# Patient Record
Sex: Female | Born: 1940 | Race: White | Hispanic: No | Marital: Married | Smoking: Never smoker
Health system: Southern US, Community
[De-identification: ages and names within clinical notes are randomized; demographics above are authoritative.]

## PROBLEM LIST (undated history)

## (undated) DIAGNOSIS — L719 Rosacea, unspecified: Secondary | ICD-10-CM

## (undated) DIAGNOSIS — E079 Disorder of thyroid, unspecified: Secondary | ICD-10-CM

## (undated) DIAGNOSIS — M8580 Other specified disorders of bone density and structure, unspecified site: Secondary | ICD-10-CM

## (undated) DIAGNOSIS — K635 Polyp of colon: Secondary | ICD-10-CM

## (undated) DIAGNOSIS — K52831 Collagenous colitis: Secondary | ICD-10-CM

## (undated) HISTORY — DX: Polyp of colon: K63.5

## (undated) HISTORY — DX: Collagenous colitis: K52.831

## (undated) HISTORY — DX: Disorder of thyroid, unspecified: E07.9

## (undated) HISTORY — PX: ABDOMINAL HYSTERECTOMY: SHX81

## (undated) HISTORY — DX: Rosacea, unspecified: L71.9

## (undated) HISTORY — DX: Other specified disorders of bone density and structure, unspecified site: M85.80

---

## 1982-05-30 HISTORY — PX: ABDOMINAL HYSTERECTOMY: SUR658

## 2002-07-25 ENCOUNTER — Ambulatory Visit (HOSPITAL_COMMUNITY): Admission: RE | Admit: 2002-07-25 | Discharge: 2002-07-25 | Payer: Self-pay | Admitting: Gastroenterology

## 2007-05-31 HISTORY — PX: THYROID SURGERY: SHX805

## 2007-08-20 ENCOUNTER — Encounter: Admission: RE | Admit: 2007-08-20 | Discharge: 2007-08-20 | Payer: Self-pay | Admitting: Gastroenterology

## 2008-01-10 ENCOUNTER — Other Ambulatory Visit: Admission: RE | Admit: 2008-01-10 | Discharge: 2008-01-10 | Payer: Self-pay | Admitting: Diagnostic Radiology

## 2008-01-10 ENCOUNTER — Encounter (INDEPENDENT_AMBULATORY_CARE_PROVIDER_SITE_OTHER): Payer: Self-pay | Admitting: Diagnostic Radiology

## 2008-01-10 ENCOUNTER — Encounter: Admission: RE | Admit: 2008-01-10 | Discharge: 2008-01-10 | Payer: Self-pay | Admitting: Family Medicine

## 2008-03-17 ENCOUNTER — Encounter (INDEPENDENT_AMBULATORY_CARE_PROVIDER_SITE_OTHER): Payer: Self-pay | Admitting: Surgery

## 2008-03-17 ENCOUNTER — Ambulatory Visit (HOSPITAL_COMMUNITY): Admission: RE | Admit: 2008-03-17 | Discharge: 2008-03-18 | Payer: Self-pay | Admitting: Surgery

## 2010-10-12 NOTE — Op Note (Signed)
NAMEASYRIA, KOLANDER NO.:  [id number]   MEDICAL RECORD NO.:  [id number]          PATIENT TYPE:  AMB   LOCATION:  DAY                          FACILITY:  [facility]   PHYSICIAN:  Currie Paris, M.D.DATE OF BIRTH:  [birth year]   DATE OF PROCEDURE:  03/17/2008  DATE OF DISCHARGE:                               OPERATIVE REPORT   PREOPERATIVE DIAGNOSIS:  1. 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  2. Hyperthyroidism.   POSTOPERATIVE DIAGNOSES:  1. 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  2. Hyperthyroidism.   OPERATION:  Total thyroidectomy.   SURGEON:  Dr. Jamey Ripa   ASSISTANT:  Dr. Johna Sheriff   ANESTHESIA:  General endotracheal.   CLINICAL HISTORY:  This is a 70 year old lady who has recently worked up  for some hearing loss, found to be somewhat hyperthyroid but also have a  cold dominant nodule on the left lobe of the thyroid which had atypical  cells on the FNA that could not rule out of papillary carcinoma.  After  lengthy discussion with the patient and in view of her with her  endocrinologist, Dr. Talmage Nap, we elected to proceed to a total  thyroidectomy.   DESCRIPTION OF PROCEDURE:  The patient was seen in the holding area.  She had no further questions.  We confirmed total thyroidectomy was the  planned procedure.   The patient taken to the operating room, after satisfactory general  endotracheal anesthesia obtained, the neck was prepped and draped as a  sterile field.  The time-out was done.   I outlined a transverse incision using this skin fold about one  fingerbreadth above the clavicular heads.  I measured at 6 cm.  The  platysma was divided and subplatysmal flaps raised.  The self-retaining  retractor was placed.   The midline fascia was opened to expose the thyroid gland.  The strap  muscles were elevated off of the left lobe and the lobe was rotated  medially.  A couple small vessels coming  into the inferior pole were  immediately visible and clipped and divided or using harmonic.  As I was  able to move this around, the lobe was quite small and I could feel the  nodule within it.  Dissection around superior pole so the superior pole  vessels readily visible and I went ahead and divided these using 2-0  silk ties plus harmonic to divide and ligate them.  With that done, I  was able to rotate the gland more medially.  I saw appeared to be an  inferior parathyroid and was careful to move that laterally.  I divided  the small vessels coming onto the thyroid lobe staying right up on the  gland to avoid injury to the nerve and as I got the gland were rotated,  I was able to dissect out and find the nerve and traced it to be sure we  did not injure it.   Against more small vessels were divided, either with using clips for  hemostasis or the harmonic.  Once I  got the gland rotated off medial  enough to see that the nerve was lying laterally, I then was able to  divide the attachments to the trachea with cautery.  I divided this and  mobilized the thyroid across the midline a little bit.   Attention was then turned to the right lobe and dissection was carried  out essentially the same.  The difference being on this side was that it  was a slightly bigger gland and I had to divide the superior pole  vessels and segments as I saw what appeared to be perhaps parathyroid  around one of the vessels.  Once I got those ligated, I was able to  again divide some of the inferior pole vessels and the medial vessels  and rotate the gland medially.  I then identified the nerve.  Once that  was successfully identified, was able to finish dividing small branches  coming into the thyroid staying high up on the thyroid gland to sweep  the tissues laterally.  I then identified again fairly clear-cut  inferior parathyroid gland and a potential superior parathyroid gland.   Once I got well medial, I  was able to then divide the remaining tracheal  attachments with cautery.  This left me with some midline attachments  superiorly.   This appeared to be a small pyramidal lobe and this was freed up and the  vessels coming, divided with clips and harmonic completing the total  thyroidectomy.   When I had finished the left side, I placed some Surgicel in place.  I  irrigated now the right side, made sure everything was dry, placed some  Surgicel here.  I turned my attention back to the left side and it had  remained dry.  I reirrigated, replaced a little Surgicel in but  everything appeared to be dry.   Midline fascia and the platysma was closed with 4-0 Vicryls.  Skin was  closed with staples, alternating with Steri-Strips.   The patient tolerated procedure well and there no complications.  All  counts were correct.      Currie Paris, M.D.  Electronically Signed     CJS/MEDQ  D:  03/17/2008  T:  03/17/2008  Job:  [id number]   cc:   Tammy R. Collins Scotland, M.D.  Fax: [phone]   Dorisann Frames, M.D.

## 2010-10-15 NOTE — Op Note (Signed)
   NAME:  Diane Becker, Diane Becker                            ACCOUNT NO.:  [id number]   MEDICAL RECORD NO.:  [id number]                   PATIENT TYPE:  AMB   LOCATION:  ENDO                                 FACILITY:  [facility]   PHYSICIAN:  Anselmo Rod, M.D.               DATE OF BIRTH:  11/09/1940   DATE OF PROCEDURE:  07/25/2002  DATE OF DISCHARGE:                                 OPERATIVE REPORT   PROCEDURE:  Colonoscopy.   ENDOSCOPIST:  Anselmo Rod, M.D.   INSTRUMENT USED:  Pediatric adjustable Olympus colonoscope.   INDICATIONS FOR PROCEDURE:  A 70 year old white female for screening  colonoscopy.  The patient has a family history of colon cancer in her  mother.  Rule out colonic polyps, masses, etc.   PREPROCEDURE PREPARATION:  Informed consent was secured from the patient.  The patient fasted for 8 hours prior to the procedure, and prepped with a  bottle of MiraLax and Gatorade the night prior to procedure.   PREPROCEDURE PHYSICAL:  VITAL SIGNS:  The patient had stable vital signs.  NECK:  Supple.  CHEST:  Clear to auscultation.  HEART:  S1, S2 regular.  ABDOMEN:  Soft with normal bowel sounds.  No masses palpable.   DESCRIPTION OF PROCEDURE:  The patient was placed in the left lateral  decubitus position and sedated with 50 mg of Demerol and 5 mg of Versed  intravenously.  Once the patient was adequately sedated and maintained on  low-flow oxygen, and continuous cardiac monitoring, the Olympus video  colonoscope was advanced into the rectum to the cecum and terminal ileum  with difficulty.  The patient had a few diverticula in the distal left  colon, but the rest of the mucosa after the cecum and terminal ileum  appeared normal.  The ileocecal valve and appendiceal orifice were clearly  visualized and photographed.   IMPRESSION:  Normal-appearing colonoscopy except for a few left-sided  diverticula.  No masses or polyps seen.   RECOMMENDATIONS:  1. A high fiber diet with  increased fluid intake has been added.  2. Repeat colorectal cancer screening was recommended in the next 5 years     unless the patient develops any abnormal symptoms intermittently.  3. The patient will follow up on a p.r.n. basis.                                               Anselmo Rod, M.D.    JNM/MEDQ  D:  07/25/2002  T:  07/25/2002  Job:  [id number]

## 2011-03-01 LAB — COMPREHENSIVE METABOLIC PANEL
Alkaline Phosphatase: 99
BUN: 11
Calcium: 9.4
Chloride: 103
Creatinine, Ser: 0.95
Glucose, Bld: 89
Potassium: 3.4 — ABNORMAL LOW
Total Bilirubin: 0.9
Total Protein: 7.3

## 2011-03-01 LAB — CALCIUM
Calcium: 8.3 — ABNORMAL LOW
Calcium: 8.6
Calcium: 8.6

## 2011-03-01 LAB — DIFFERENTIAL
Basophils Absolute: 0
Lymphocytes Relative: 35
Neutro Abs: 2.3
Neutrophils Relative %: 52

## 2011-03-01 LAB — URINALYSIS, ROUTINE W REFLEX MICROSCOPIC
Bilirubin Urine: NEGATIVE
Ketones, ur: NEGATIVE
Protein, ur: NEGATIVE
Urobilinogen, UA: 0.2

## 2011-03-01 LAB — T3: T3, Total: 116 (ref 80.0–204.0)

## 2011-03-01 LAB — CBC
Hemoglobin: 13.7
MCV: 91.4
Platelets: 309
RBC: 4.46
RDW: 13.7
WBC: 4.4

## 2011-11-14 LAB — HM COLONOSCOPY

## 2012-03-06 ENCOUNTER — Encounter: Payer: Self-pay | Admitting: Family Medicine

## 2012-03-29 ENCOUNTER — Telehealth: Payer: Self-pay | Admitting: Family Medicine

## 2012-03-29 NOTE — Telephone Encounter (Signed)
Spoke with patient.

## 2012-03-29 NOTE — Telephone Encounter (Signed)
Pt called and is req call back from [facility]. Pt said that she rcvd a call from someone at [facility].

## 2012-04-13 ENCOUNTER — Ambulatory Visit (INDEPENDENT_AMBULATORY_CARE_PROVIDER_SITE_OTHER): Payer: Medicare Other | Admitting: Family Medicine

## 2012-04-13 ENCOUNTER — Encounter: Payer: Self-pay | Admitting: Family Medicine

## 2012-04-13 VITALS — BP 104/68 | HR 60 | Temp 98.8°F | Ht 60.0 in | Wt 105.0 lb

## 2012-04-13 DIAGNOSIS — K5289 Other specified noninfective gastroenteritis and colitis: Secondary | ICD-10-CM

## 2012-04-13 DIAGNOSIS — L719 Rosacea, unspecified: Secondary | ICD-10-CM | POA: Insufficient documentation

## 2012-04-13 DIAGNOSIS — E039 Hypothyroidism, unspecified: Secondary | ICD-10-CM | POA: Insufficient documentation

## 2012-04-13 DIAGNOSIS — K52831 Collagenous colitis: Secondary | ICD-10-CM | POA: Insufficient documentation

## 2012-04-13 NOTE — Progress Notes (Signed)
Chief Complaint  Patient presents with  . Establish Care    HPI:  Diane Becker is here to establish care. Had been seeing Dr. Collins Scotland but moved care to here because husband comes here. Last physical in 07/2011. Does get regular exercise - walks [facility]. Eats healthy diet. Retired Engineer, structural. UTD on mammo and colonoscopy. Bone dexa last year - gets ever 3-5 years and stable. [employer] - but attends [employer] with husband.   Has the following concerns today:  Hypothyroidism: -s/p thyroidectomy, for nodules -stable on synthroid daily except two days per week - prescribed by endocrinologist -TSH stable for a long time, last check with physical in 07/2011  Other Providers: -Eye Doctor, Dr. Micheal Likens, for vision -Dr. Loreta Ave Lewayne Bunting, [facility]), GI, for hx of colitis, last colonoscopy 4 months ago for colitis - gets every five years for FH colon history -Dr. Nicholas Lose: Derm, rosacea and ocular rosacea  Vaccines: thinks UTD on tetanus and will get dates on this to [place], had shingles vaccines. Refused flu and pneumonia vaccines.   ROS: See pertinent positives and negatives per HPI. See scanned information.  Past Medical History  Diagnosis Date  . Colon polyps   . Thyroid disease   . Rosacea     sees dermatologist Dr. Nicholas Lose for this  . Colitis, collagenous     Followed by Dr. Loreta Ave at [facility] in GI    Family History  Problem Relation Age of Onset  . Colon cancer      History   Social History  . Marital Status: Married    Spouse Name: N/A    Number of Children: N/A  . Years of Education: N/A   Social History Main Topics  . Smoking status: Never Smoker   . Smokeless tobacco: None  . Alcohol Use: No  . Drug Use:   . Sexually Active:    Other Topics Concern  . None   Social History Narrative  . None    Current outpatient prescriptions:aspirin 81 MG tablet, Take 81 mg by mouth daily., Disp: , Rfl: ;  Biotin 10 MG TABS, Take  by mouth., Disp: , Rfl: ;  calcium-vitamin D (OSCAL WITH D) 500-200 MG-UNIT per tablet, Take 1 tablet by mouth daily., Disp: , Rfl: ;  doxycycline (VIBRAMYCIN) 100 MG capsule, Take 100 mg by mouth 3 (three) times a week. , Disp: , Rfl:  levothyroxine (SYNTHROID, LEVOTHROID) 50 MCG tablet, Take 50 mcg by mouth daily., Disp: , Rfl: ;  Multiple Vitamin (MULTIVITAMIN) tablet, Take 1 tablet by mouth daily., Disp: , Rfl:   EXAM:  Filed Vitals:   04/13/12 1310  BP: 104/68  Pulse: 60  Temp: 98.8 F (37.1 C)    Body mass index is 20.51 kg/(m^2).  GENERAL: vitals reviewed and listed above, alert, oriented, appears well hydrated and in no acute distress  HEENT: atraumatic, conjunttiva clear, no obvious abnormalities on inspection of external nose and ears  NECK: no obvious masses on inspection  LUNGS: clear to auscultation bilaterally, no wheezes, rales or rhonchi, good air movement  CV: HRRR, no peripheral edema  MS: moves all extremities without noticeable abnormality  PSYCH: pleasant and cooperative, no obvious depression or anxiety  ASSESSMENT AND PLAN:  Discussed the following assessment and plan:  1. Hypothyroid   2. Colitis, collagenous - followed by Dr. Loreta Ave at [facility]   3. Rosacea - followed by Dr. Micheal Likens   -offered to check [facility] today -  pt reports only gets this annually and requested we do it at her physical in a few months -We reviewed the PMH, PSH, FH, SH, Meds and Allergies. -We provided refills for any medications we will prescribe as needed. -We addressed current concerns per orders and patient instructions. -We have asked for records for pertinent exams, studies, vaccines and notes from previous providers. -We have advised patient to follow up per instructions below.   -Patient advised to return or notify a doctor immediately if symptoms worsen or persist or new concerns arise.  Patient Instructions    -PLEASE SIGN UP FOR MYCHART TODAY   We recommend  the following healthy lifestyle measures: - eat a healthy diet consisting of lots of vegetables, fruits, beans, nuts, seeds, healthy meats such as white chicken and fish and whole grains.  - avoid fried foods, fast food, processed foods, sodas, red meet and other fattening foods.  - get a least 150 minutes of aerobic exercise per week.   Follow up in: in the spring for your yearly physical      Terressa Koyanagi.

## 2012-04-13 NOTE — Patient Instructions (Addendum)
 -  PLEASE SIGN UP FOR MYCHART TODAY   We recommend the following healthy lifestyle measures: - eat a healthy diet consisting of lots of vegetables, fruits, beans, nuts, seeds, healthy meats such as white chicken and fish and whole grains.  - avoid fried foods, fast food, processed foods, sodas, red meet and other fattening foods.  - get a least 150 minutes of aerobic exercise per week.   Follow up in: in the spring for your yearly physical

## 2012-07-11 ENCOUNTER — Ambulatory Visit (INDEPENDENT_AMBULATORY_CARE_PROVIDER_SITE_OTHER): Payer: Medicare Other | Admitting: Family Medicine

## 2012-07-11 ENCOUNTER — Encounter: Payer: Self-pay | Admitting: Family Medicine

## 2012-07-11 VITALS — BP 100/76 | HR 91 | Temp 97.5°F | Ht 60.0 in | Wt 97.0 lb

## 2012-07-11 DIAGNOSIS — Z23 Encounter for immunization: Secondary | ICD-10-CM

## 2012-07-11 DIAGNOSIS — Z0000 Encounter for general adult medical examination without abnormal findings: Secondary | ICD-10-CM

## 2012-07-11 DIAGNOSIS — E039 Hypothyroidism, unspecified: Secondary | ICD-10-CM

## 2012-07-11 DIAGNOSIS — Z131 Encounter for screening for diabetes mellitus: Secondary | ICD-10-CM

## 2012-07-11 LAB — CBC WITH DIFFERENTIAL/PLATELET
Basophils Relative: 1 % (ref 0.0–3.0)
Eosinophils Absolute: 0 10*3/uL (ref 0.0–0.7)
Eosinophils Relative: 0.3 % (ref 0.0–5.0)
Hemoglobin: 14 g/dL (ref 12.0–15.0)
MCHC: 33.1 g/dL (ref 30.0–36.0)
MCV: 93.2 fl (ref 78.0–100.0)
Monocytes Absolute: 0.3 10*3/uL (ref 0.1–1.0)
Neutrophils Relative %: 65.3 % (ref 43.0–77.0)
RBC: 4.54 Mil/uL (ref 3.87–5.11)

## 2012-07-11 LAB — LIPID PANEL: Total CHOL/HDL Ratio: 4

## 2012-07-11 LAB — BASIC METABOLIC PANEL
BUN: 16 mg/dL (ref 6–23)
CO2: 26 mEq/L (ref 19–32)
Calcium: 9.4 mg/dL (ref 8.4–10.5)
Potassium: 3.5 mEq/L (ref 3.5–5.1)

## 2012-07-11 NOTE — Progress Notes (Signed)
Chief Complaint  Patient presents with  . Annual Exam    HPI:  Here for CPE:  -Concerns today:  Wants to check TSH today Has collagenous colitis and has been on oral budesonide for this for several months finished yesterday - is worried about her bone health. Has appt with Dr. Loreta Ave. She thinks she had a bone density test last year which was ok per her report - done with [facility] on [address].  -Diet: variety of foods, balance and well rounded, larger portion sizes - was eating a lot of carbs but now working on decreasing   -Exercise: walks [facility] and [facility]  -Diabetes and Dyslipidemia Screening: will check today  -Hx of HTN: no  -Vaccines: UTD on shingles, wants tdap today, pneumo and flu discussed at last visit  -pap history: s/p hysterectomy  -FDLMP: N/A  -sexual activity: yes, female partner, no new partners  -wants STI testing: no  -FH breast, colon or ovarian ca: see FH -had mammogram 1 year ago -had bone density 1 year ago -had colonoscpy in 10/2011  -Alcohol, Tobacco, drug use: see social history  Review of Systems - denies CP, SOB, fevers, sudden unintentional weigth changes, rashes or worrisome skin lesions, bleeding, falls, depression, does admit to fatigue  Past Medical History  Diagnosis Date  . Colon polyps   . Thyroid disease   . Rosacea     sees dermatologist Dr. Nicholas Lose for this  . Colitis, collagenous     Followed by Dr. Lore[facility] Ave at [facility] in GI    Family History  Problem Relation Age of Onset  . Colon cancer      History   Social History  . Marital Status: Married    Spouse Name: N/A    Number of Children: N/A  . Years of Education: N/A   Social History Main Topics  . Smoking status: Never Smoker   . Smokeless tobacco: None  . Alcohol Use: No  . Drug Use:   . Sexually Active:    Other Topics Concern  . None   Social History Narrative  . None    Current outpatient prescriptions:aspirin 81 MG tablet, Take 81 mg by mouth  daily., Disp: , Rfl: ;  Biotin 10 MG TABS, Take by mouth., Disp: , Rfl: ;  calcium-vitamin D (OSCAL WITH D) 500-200 MG-UNIT per tablet, Take 1 tablet by mouth daily., Disp: , Rfl: ;  doxycycline (VIBRAMYCIN) 100 MG capsule, Take 100 mg by mouth 3 (three) times a week. , Disp: , Rfl:  levothyroxine (SYNTHROID, LEVOTHROID) 50 MCG tablet, Take 50 mcg by mouth daily., Disp: , Rfl: ;  Multiple Vitamin (MULTIVITAMIN) tablet, Take 1 tablet by mouth daily., Disp: , Rfl: ;  Probiotic Product (PROBIOTIC DAILY PO), Take by mouth daily., Disp: , Rfl: ;  budesonide (ENTOCORT EC) 3 MG 24 hr capsule, Take 6 mg by mouth every morning., Disp: , Rfl:   EXAM:  Filed Vitals:   07/11/12 0800  BP: 100/76  Pulse: 91  Temp: 97.5 F (36.4 C)    GENERAL: vitals reviewed and listed below, alert, oriented, appears well hydrated and in no acute distress  HEENT: head atraumatic, PERRLA, normal appearance of eyes, ears, nose and mouth. moist mucus membranes.  NECK: supple, no masses or lymphadenopathy  LUNGS: clear to auscultation bilaterally, no rales, rhonchi or wheeze  CV: HRRR, no peripheral edema or cyanosis, normal pedal pulses  BREAST: normal appearance - no lesions or discharge, on palpation normal breast tissue  without any suspicious masses, breast implants bilat  ABDOMEN: bowel sounds normal, soft, non tender to palpation, no masses, no rebound or guarding  GU: deferred  RECTAL: refused  SKIN: no rash or abnormal lesions  MS: normal gait, moves all extremities normally  NEURO: CN II-XII grossly intact, normal muscle strength and sensation to light touch on extremities  PSYCH: normal affect, pleasant and cooperative  ASSESSMENT AND PLAN:  Discussed the following assessment and plan:  1. Hypothyroid  TSH   TSH  2. Encounter for preventive health examination  Lipid Panel   Lipid Panel   Hemoglobin A1c   CBC with Differential   Basic metabolic panel   Vitamin D, 25-hydroxy     -Discussed  and advised all [place] preventive services health task force level A and B recommendations for age, sex and risks. -FASTING LABS: lipids, hgba1c, TSH, adding CBC amd BMP given fatigue and steroid use - advised HgbA1c could be elevated given recent steroid use  -tdap today -will attempt to get last dexa results and will repeat if >2 years -Advised at least 150 minutes of exercise per week and a healthy diet low in saturated fats and sweets and consisting of fresh fruits and vegetables, lean meats such as fish and white chicken and whole grains. -follow up in 6 months or sooner if concerns or pending labs  -labs, studies and vaccines per orders this encounter  Orders Placed This Encounter  Procedures  . TSH  . Lipid Panel  . Hemoglobin A1c  . CBC with Differential  . Basic metabolic panel  . Vitamin D, 25-hydroxy    There are no Patient Instructions on file for this visit.  Patient advised to return to clinic immediately if symptoms worsen or persist or new concerns.  @LIFEPLAN @  No Follow-up on file.  Kriste Basque R.

## 2012-07-11 NOTE — Addendum Note (Signed)
Addended by: Azucena Freed on: 07/11/2012 09:09 AM   Modules accepted: Orders

## 2012-07-12 LAB — VITAMIN D 25 HYDROXY (VIT D DEFICIENCY, FRACTURES): Vit D, 25-Hydroxy: 52 ng/mL (ref 30–89)

## 2012-07-14 ENCOUNTER — Telehealth: Payer: Self-pay | Admitting: Family Medicine

## 2012-07-14 NOTE — Telephone Encounter (Signed)
Please let her know, -most labs including diabetes lab looked good except for cholesterol which is quite high -would advised appointment sometime in the next few months to discuss this and treatment options

## 2012-07-16 NOTE — Telephone Encounter (Signed)
Left a message for pt to return call.  Also made pt aware that [facility] information was received.

## 2012-07-16 NOTE — Telephone Encounter (Signed)
Called and spoke with pt and pt is aware.  

## 2012-07-18 ENCOUNTER — Encounter: Payer: Self-pay | Admitting: Family Medicine

## 2012-07-20 ENCOUNTER — Encounter: Payer: Self-pay | Admitting: Family Medicine

## 2012-07-20 NOTE — Progress Notes (Signed)
DEXA and Mammo from 04/2011 received.  DEXA with mild osteopenia - recs for cal/vit D/exercise and repeat in 2 years. S/p boniva therapy.  Mammo normal

## 2012-09-18 ENCOUNTER — Other Ambulatory Visit: Payer: Self-pay

## 2012-09-18 MED ORDER — LEVOTHYROXINE SODIUM 50 MCG PO TABS: ORAL_TABLET | ORAL | Status: DC

## 2012-09-18 NOTE — Telephone Encounter (Signed)
Pt left a form to fax back for levothyroxine 50 mcg- take 1 tablet by mouth Monday through Friday and take 2 tablets by mouth Saturday and Sunday. Pt request 90 day supply. Rx faxed to pharmacy.   Called pt to make aware.

## 2012-09-20 ENCOUNTER — Telehealth: Payer: Self-pay | Admitting: Family Medicine

## 2012-09-20 MED ORDER — ONE-DAILY MULTI VITAMINS PO TABS: ORAL_TABLET | ORAL | Status: DC

## 2012-09-20 NOTE — Telephone Encounter (Signed)
Rx sent to [facility] with correct qty.

## 2012-09-20 NOTE — Telephone Encounter (Signed)
PT called and stated that [facility] called her regarding her rx of levothyroxine (SYNTHROID, LEVOTHROID) 50 MCG tablet. They told her that the quantity needed to be 108. Please assist.

## 2012-09-25 ENCOUNTER — Telehealth: Payer: Self-pay

## 2012-09-25 NOTE — Telephone Encounter (Signed)
Called [facility] and spoke with Diane Becker and he states the multivitamin rx was closed out. Pt is aware.

## 2012-09-25 NOTE — Telephone Encounter (Signed)
Pt called and states that multivitamin was sent to [facility].  Advised pt that [facility] would be called and rx cancelled.

## 2012-12-14 ENCOUNTER — Encounter: Payer: Self-pay | Admitting: Family Medicine

## 2013-02-12 ENCOUNTER — Other Ambulatory Visit: Payer: Self-pay

## 2013-02-12 MED ORDER — LEVOTHYROXINE SODIUM 50 MCG PO TABS: ORAL_TABLET | ORAL | Status: DC

## 2013-04-04 ENCOUNTER — Other Ambulatory Visit: Payer: Self-pay

## 2013-06-07 ENCOUNTER — Telehealth: Payer: Self-pay | Admitting: Family Medicine

## 2013-06-07 MED ORDER — LEVOTHYROXINE SODIUM 50 MCG PO TABS: ORAL_TABLET | ORAL | Status: DC

## 2013-06-07 NOTE — Telephone Encounter (Signed)
Pt has appt in feb. Pt need new rx levothyroxine 50 mcg #30 no refills sent to [facility].

## 2013-06-07 NOTE — Telephone Encounter (Signed)
Rx sent to pharmacy   

## 2013-07-12 ENCOUNTER — Ambulatory Visit (INDEPENDENT_AMBULATORY_CARE_PROVIDER_SITE_OTHER): Payer: Medicare HMO | Admitting: Family Medicine

## 2013-07-12 ENCOUNTER — Encounter: Payer: Self-pay | Admitting: Family Medicine

## 2013-07-12 VITALS — BP 110/60 | Temp 98.7°F | Wt 97.0 lb

## 2013-07-12 DIAGNOSIS — M8580 Other specified disorders of bone density and structure, unspecified site: Secondary | ICD-10-CM

## 2013-07-12 DIAGNOSIS — E785 Hyperlipidemia, unspecified: Secondary | ICD-10-CM

## 2013-07-12 DIAGNOSIS — M899 Disorder of bone, unspecified: Secondary | ICD-10-CM

## 2013-07-12 DIAGNOSIS — L719 Rosacea, unspecified: Secondary | ICD-10-CM

## 2013-07-12 DIAGNOSIS — E039 Hypothyroidism, unspecified: Secondary | ICD-10-CM

## 2013-07-12 DIAGNOSIS — R252 Cramp and spasm: Secondary | ICD-10-CM

## 2013-07-12 DIAGNOSIS — Z0000 Encounter for general adult medical examination without abnormal findings: Secondary | ICD-10-CM

## 2013-07-12 DIAGNOSIS — M949 Disorder of cartilage, unspecified: Secondary | ICD-10-CM

## 2013-07-12 LAB — LIPID PANEL
CHOL/HDL RATIO: 4
Cholesterol: 289 mg/dL — ABNORMAL HIGH (ref 0–200)
HDL: 77.5 mg/dL (ref >39.00)
TRIGLYCERIDES: 125 mg/dL (ref 0.0–149.0)
VLDL: 25 mg/dL (ref 0.0–40.0)

## 2013-07-12 LAB — TSH: TSH: 2.47 u[IU]/mL (ref 0.35–5.50)

## 2013-07-12 LAB — T4, FREE: FREE T4: 1.36 ng/dL (ref 0.60–1.60)

## 2013-07-12 LAB — BASIC METABOLIC PANEL
BUN: 24 mg/dL — AB (ref 6–23)
CHLORIDE: 105 meq/L (ref 96–112)
CO2: 25 mEq/L (ref 19–32)
CREATININE: 2.8 mg/dL — AB (ref 0.4–1.2)
Calcium: 9.5 mg/dL (ref 8.4–10.5)
GFR: 17.66 mL/min — AB (ref >60.00)
Glucose, Bld: 86 mg/dL (ref 70–99)
POTASSIUM: 4.9 meq/L (ref 3.5–5.1)
Sodium: 139 mEq/L (ref 135–145)

## 2013-07-12 LAB — LDL CHOLESTEROL, DIRECT: Direct LDL: 181.3 mg/dL

## 2013-07-12 MED ORDER — LEVOTHYROXINE SODIUM 50 MCG PO TABS: ORAL_TABLET | ORAL | Status: DC

## 2013-07-12 NOTE — Progress Notes (Signed)
Pre visit review using our clinic review tool, if applicable. No additional management support is needed unless otherwise documented below in the visit note. 

## 2013-07-12 NOTE — Progress Notes (Signed)
/;jMedicare Annual Preventive Care Visit  (initial annual wellness or annual wellness exam)  Follow up and/or concerns:  Hypothyroidism: -stable -needs tsh check  Foot and leg cramps: -improved now, occasional 1x per month -sometimes in leg, sometimes in foot usually at night  Colitis, collagenous: -followed by Dr. Collene Mares -on 3 month course of oral steroid for this  Osteopenia: -on boniva in the past for 5 years in the past -R hip pain recenlty - lateral hip  Not exercising due to the cold and sits around and reads a lot. When not exercising feel more tired at night. Not really fatigued. She does still walk most days. Denies: fevers, weight changes.  1.) Patient-completed health risk assessment  - completed and reviewed, see scanned documentation  2.) Review of Medical History: -PMH, PSH, Family History and current specialty and care providers reviewed and updated and listed below  - see chart and below  3.) Review of functional ability and level of safety:  Any difficulty hearing? NO  History of falling? NO  Any trouble with IADLs - using a phone, using transportation, grocery shopping, preparing meals, doing housework, doing laundry, taking medications and managing money? NO  Advance Directives? YES   See summary of recommendations in Patient Instructions below.  4.) Physical Exam Filed Vitals:   07/12/13 0809  BP: 110/60  Temp: 98.7 F (37.1 C)   Estimated body mass index is 18.94 kg/(m^2) as calculated from the following:   Height as of 07/11/12: 5' (1.524 m).   Weight as of this encounter: 97 lb (43.999 kg).   NECK:  HEART:  LUNGS: Mini Cog: 1. Patient instructed to listen carefully and repeat the following: 3/3 Apple Watch    Penny  2. Clock drawing test was administered: NORMAL      3. Recall of three words: 3/3  Scoring:  Patient Score: NEG  See scanned doc  See patient instructions for recommendations.  4)The following written  screening schedule of preventive measures were reviewed with assessment and plan made per below, orders and patient instructions:           Alcohol screening: none     Obesity Screening and counseling: NA     STI screening: deferred     Tobacco Screening: done       Pneumococcal (PPSV23 -one dose after 64, one before if risk factors), influenza yearly and hepatitis B vaccines (if high risk - end stage renal disease, IV drugs, homosexual men, live in home for mentally retarded, hemophilia receiving factors) ASSESSMENT/PLAN: refused      Screening mammograph (yearly if >40) ASSESSMENT/PLAN: does yearly, 10/2012      Colorectal cancer screening (FOBT yearly or flex sig q4y or colonoscopy q10y or barium enema q4y) ASSESSMENT/PLAN: done 10/2011      Diabetes outpatient self-management training services ASSESSMENT/PLAN: N/A      Bone mass measurements(covered q2y if indicated - estrogen def, osteoporosis, hyperparathyroid, vertebral abnormalities, osteoporosis or steroids) ASSESSMENT/PLAN: done 2012, offered - she will set up      Screening for glaucoma(q1y if high risk - diabetes, FH, AA and > 50 or hispanic and > 65) ASSESSMENT/PLAN: done      Medical nutritional therapy for individuals with diabetes or renal disease ASSESSMENT/PLAN: N/A      Cardiovascular screening blood tests (lipids q5y) ASSESSMENT/PLAN:  Done 2014 - will check today as well      Diabetes screening tests ASSESSMENT/PLAN: done 2014   7.) Summary: -risk factors and conditions per above assessment  were discussed and treatment, recommendations and referrals were offered per documentation above and orders and patient instructions.  TSH today FASTING LABS today  Medicare annual wellness visit, subsequent  Rosacea - followed by Dr. Ubaldo Glassing  - Plan: Ambulatory referral to Dermatology  Hypothyroid - Plan: TSH, T4, Free  Leg cramps - Plan: Basic metabolic panel  Osteopenia - Plan: Vitamin D, 25-hydroxy  Other  and unspecified hyperlipidemia - Plan: Lipid Panel

## 2013-07-12 NOTE — Patient Instructions (Signed)
-  Vitamin D3 1000 IU daily - 1200mg  of calcium daily (likely half of this is in diet)  -heat and stretching for hip and follow up in 1 month if persists      Alcohol screening: none     Obesity Screening and counseling: NA     STI screening: deferred     Tobacco Screening: done       Pneumococcal (PPSV23 -one dose after 64, one before if risk factors), influenza yearly and hepatitis B vaccines (if high risk - end stage renal disease, IV drugs, homosexual men, live in home for mentally retarded, hemophilia receiving factors) ASSESSMENT/PLAN: refused      Screening mammograph (yearly if >40) ASSESSMENT/PLAN: does yearly, 10/2012      Colorectal cancer screening (FOBT yearly or flex sig q4y or colonoscopy q10y or barium enema q4y) ASSESSMENT/PLAN: done 10/2011      Diabetes outpatient self-management training services ASSESSMENT/PLAN: N/A      Bone mass measurements(covered q2y if indicated - estrogen def, osteoporosis, hyperparathyroid, vertebral abnormalities, osteoporosis or steroids) ASSESSMENT/PLAN: done 2012, offered - she will set up      Screening for glaucoma(q1y if high risk - diabetes, FH, AA and > 50 or hispanic and > 65) ASSESSMENT/PLAN: done      Medical nutritional therapy for individuals with diabetes or renal disease ASSESSMENT/PLAN: N/A      Cardiovascular screening blood tests (lipids q5y) ASSESSMENT/PLAN:  Done 2014 - will check today as well      Diabetes screening tests ASSESSMENT/PLAN: done 2014

## 2013-07-13 LAB — VITAMIN D 25 HYDROXY (VIT D DEFICIENCY, FRACTURES): Vit D, 25-Hydroxy: 53 ng/mL (ref 30–89)

## 2013-07-15 ENCOUNTER — Other Ambulatory Visit: Payer: Self-pay | Admitting: Family Medicine

## 2013-07-15 ENCOUNTER — Other Ambulatory Visit (INDEPENDENT_AMBULATORY_CARE_PROVIDER_SITE_OTHER): Payer: Medicare HMO

## 2013-07-15 DIAGNOSIS — N289 Disorder of kidney and ureter, unspecified: Secondary | ICD-10-CM

## 2013-07-15 LAB — BASIC METABOLIC PANEL
BUN: 16 mg/dL (ref 6–23)
CHLORIDE: 101 meq/L (ref 96–112)
CO2: 28 mEq/L (ref 19–32)
Calcium: 8.9 mg/dL (ref 8.4–10.5)
Creatinine, Ser: 0.9 mg/dL (ref 0.4–1.2)
GFR: 70.19 mL/min (ref >60.00)
Glucose, Bld: 80 mg/dL (ref 70–99)
POTASSIUM: 3.4 meq/L — AB (ref 3.5–5.1)
SODIUM: 136 meq/L (ref 135–145)

## 2013-08-16 ENCOUNTER — Telehealth: Payer: Self-pay

## 2013-08-16 NOTE — Telephone Encounter (Signed)
Pt received a message from [facility] office and this is not pt's dermatology office. Pt has established with Dr. Ubaldo Glassing and pt will be seeing him.

## 2013-09-24 ENCOUNTER — Other Ambulatory Visit: Payer: Self-pay | Admitting: Family Medicine

## 2013-09-24 ENCOUNTER — Ambulatory Visit (INDEPENDENT_AMBULATORY_CARE_PROVIDER_SITE_OTHER)
Admission: RE | Admit: 2013-09-24 | Discharge: 2013-09-24 | Disposition: A | Discharge status: Home / Self Care | Payer: Medicare HMO | Source: Ambulatory Visit | Attending: Family Medicine | Admitting: Family Medicine

## 2013-09-24 DIAGNOSIS — M79609 Pain in unspecified limb: Secondary | ICD-10-CM

## 2013-09-24 DIAGNOSIS — M79671 Pain in right foot: Secondary | ICD-10-CM

## 2013-09-25 ENCOUNTER — Telehealth: Payer: Self-pay | Admitting: *Deleted

## 2013-09-25 NOTE — Telephone Encounter (Signed)
Diane Becker is a 73 year old female who [place] and had severe pain in the lateral portion of the right lower foot. She elevated it and iced it. She has a neighbor so I saw her on Monday morning. She has some ecchymosis basilar right fifth metatarsal. I sent her for x-ray which showed a nondisplaced fracture of the base of the fifth metatarsal. Consult with Dr. Durward Fortes he concurred elevation ice immobilization.  She has a history of osteopenia and was on the nevi for 5 years

## 2013-10-16 ENCOUNTER — Other Ambulatory Visit: Payer: Self-pay | Admitting: Family Medicine

## 2013-10-16 DIAGNOSIS — S92901A Unspecified fracture of right foot, initial encounter for closed fracture: Secondary | ICD-10-CM

## 2013-10-22 ENCOUNTER — Other Ambulatory Visit: Payer: Self-pay | Admitting: *Deleted

## 2013-10-22 ENCOUNTER — Ambulatory Visit (INDEPENDENT_AMBULATORY_CARE_PROVIDER_SITE_OTHER)
Admission: RE | Admit: 2013-10-22 | Discharge: 2013-10-22 | Disposition: A | Discharge status: Home / Self Care | Payer: Medicare HMO | Source: Ambulatory Visit | Attending: Family Medicine | Admitting: Family Medicine

## 2013-10-22 DIAGNOSIS — S92909A Unspecified fracture of unspecified foot, initial encounter for closed fracture: Secondary | ICD-10-CM

## 2013-10-22 DIAGNOSIS — S92901A Unspecified fracture of right foot, initial encounter for closed fracture: Secondary | ICD-10-CM

## 2013-10-22 MED ORDER — BUDESONIDE 3 MG PO CP24: 6.0000 mg | ORAL_CAPSULE | Freq: Three times a day (TID) | ORAL | Status: DC

## 2013-10-31 ENCOUNTER — Encounter: Payer: Self-pay | Admitting: *Deleted

## 2013-12-25 ENCOUNTER — Telehealth: Payer: Self-pay | Admitting: Family Medicine

## 2013-12-25 DIAGNOSIS — Z1239 Encounter for other screening for malignant neoplasm of breast: Secondary | ICD-10-CM

## 2013-12-25 NOTE — Telephone Encounter (Signed)
I called the pt and advised her the order was placed and someone from [place] will call with an appt.  She stated she already has an appt on 8/6 and I informed Neoma Laming of this.

## 2013-12-25 NOTE — Telephone Encounter (Signed)
Pt req referral to [facility] on [address] for a mammogram

## 2014-01-02 LAB — HM MAMMOGRAPHY: HM Mammogram: NEGATIVE

## 2014-01-03 ENCOUNTER — Encounter: Payer: Self-pay | Admitting: Family Medicine

## 2014-01-17 ENCOUNTER — Telehealth: Payer: Self-pay | Admitting: *Deleted

## 2014-01-17 NOTE — Telephone Encounter (Signed)
Dr Maudie Mercury reviewed the bone density test from [facility]. I called the pt and informed her Dr Maudie Mercury stated there was a decrease in bone density compared to the test from 05/03/2011 and to be sure to have a repeat test in 2 years, continue dietary intake of calcium and vitamin D and weight bearing exercises as tolerated and the pt agreed.

## 2014-01-30 ENCOUNTER — Encounter: Payer: Self-pay | Admitting: Family Medicine

## 2014-06-25 ENCOUNTER — Telehealth: Payer: Self-pay | Admitting: Family Medicine

## 2014-06-25 MED ORDER — LEVOTHYROXINE SODIUM 50 MCG PO TABS: ORAL_TABLET | ORAL | Status: DC

## 2014-06-25 NOTE — Telephone Encounter (Signed)
Rx done. 

## 2014-06-25 NOTE — Telephone Encounter (Signed)
Pt request refill of the following: levothyroxine (SYNTHROID, LEVOTHROID) 50 MCG tablet   Phamacy: [facility]

## 2014-08-28 ENCOUNTER — Ambulatory Visit (INDEPENDENT_AMBULATORY_CARE_PROVIDER_SITE_OTHER): Payer: Commercial Managed Care - HMO | Admitting: Family Medicine

## 2014-08-28 ENCOUNTER — Encounter: Payer: Self-pay | Admitting: Family Medicine

## 2014-08-28 VITALS — BP 112/68 | HR 65 | Temp 97.9°F | Ht 61.0 in | Wt 102.0 lb

## 2014-08-28 DIAGNOSIS — E039 Hypothyroidism, unspecified: Secondary | ICD-10-CM | POA: Diagnosis not present

## 2014-08-28 DIAGNOSIS — R0789 Other chest pain: Secondary | ICD-10-CM

## 2014-08-28 DIAGNOSIS — Z0000 Encounter for general adult medical examination without abnormal findings: Secondary | ICD-10-CM

## 2014-08-28 DIAGNOSIS — R899 Unspecified abnormal finding in specimens from other organs, systems and tissues: Secondary | ICD-10-CM

## 2014-08-28 DIAGNOSIS — E875 Hyperkalemia: Secondary | ICD-10-CM | POA: Diagnosis not present

## 2014-08-28 LAB — BASIC METABOLIC PANEL
BUN: 20 mg/dL (ref 6–23)
CO2: 28 mEq/L (ref 19–32)
Calcium: 10 mg/dL (ref 8.4–10.5)
Chloride: 104 mEq/L (ref 96–112)
Creatinine, Ser: 1.09 mg/dL (ref 0.40–1.20)
GFR: 52.51 mL/min — AB (ref >60.00)
Glucose, Bld: 95 mg/dL (ref 70–99)
POTASSIUM: 5.7 meq/L — AB (ref 3.5–5.1)
SODIUM: 139 meq/L (ref 135–145)

## 2014-08-28 LAB — TSH: TSH: 7.99 u[IU]/mL — AB (ref 0.35–4.50)

## 2014-08-28 NOTE — Progress Notes (Signed)
Medicare Annual Preventive Care Visit  (initial annual wellness or annual wellness exam)  Diane Becker is a 74 yo here for her medicare wellness exam and follow up.  Hypothyroidism: -stable on stame dose of synthroid for some time -denies: changes in weight, changes in bowels, skin changes  Collagenous Colitis: -managed by her gastroenterologist, Dr. Collene Mares -reports: changes in bowels, abd pain, blood in stools  Atypical Chest Pain: -she has noticed for 6-8 months, sometimes feel tired, worries about her heart -a few times per week notices "anxious feeling"/discomfort in chest -in the evenings or at night - last for 1 hour -at rest -sometime before dinner -she is worried about her heart and wants evaluation prior to starting gardening -no chest pain or discomfort today, no DOE, no orthopnea, reflux, heartburn  ROS: negative for report of fevers, unintentional weight loss, vision changes, vision loss, hearing loss or change, chest pain, sob, hemoptysis, melena, hematochezia, hematuria, genital discharge or lesions, falls, bleeding or bruising, loc, thoughts of suicide or self harm, memory loss  1.) Patient-completed health risk assessment  - completed and reviewed, see scanned documentation  2.) Review of Medical History: -PMH, PSH, Family History and current specialty and care providers reviewed and updated and listed below  - see scanned in document in chart and below  Past Medical History  Diagnosis Date  . Colon polyps   . Thyroid disease   . Rosacea     sees dermatologist Dr. Ubaldo Glassing for this  . Colitis, collagenous     Followed by Dr. Collene Mares at [facility] in GI  . Osteopenia     on > 5 years of bisphosphonate in the past    Past Surgical History  Procedure Laterality Date  . Abdominal hysterectomy  1984  . Thyroid surgery  2009    Dr. Margot Chimes   . Abdominal hysterectomy      for menorrhagia, no cervix per pt    History   Social History  . Marital Status:  Married    Spouse Name: N/A  . Number of Children: N/A  . Years of Education: N/A   Occupational History  . Not on file.   Social History Main Topics  . Smoking status: Never Smoker   . Smokeless tobacco: Not on file  . Alcohol Use: No  . Drug Use: Not on file  . Sexual Activity: Not on file   Other Topics Concern  . Not on file   Social History Narrative    The patient has a family history of  3.) Review of functional ability and level of safety:  Any difficulty hearing? NO  History of falling?  NO  Any trouble with IADLs - using a phone, using transportation, grocery shopping, preparing meals, doing housework, doing laundry, taking medications and managing money?  NO  Advance Directives? YES   See summary of recommendations in Patient Instructions below.  4.) Physical Exam Filed Vitals:   08/28/14 0812  BP: 112/68  Pulse: 65  Temp: 97.9 F (36.6 C)   Estimated body mass index is 19.28 kg/(m^2) as calculated from the following:   Height as of this encounter: 5\' 1"  (1.549 m).   Weight as of this encounter: 102 lb (46.267 kg).  EKG (optional): deferred  General: alert, appear well hydrated and in no acute distress  HEENT: visual acuity grossly intact, normal inspection of ear, nose and mouth  NECK: no masses  CV: HRRR, no JVD  MS: no chest wall TTP  Lungs: CTA  bilaterally  Psych: pleasant and cooperative, no obvious depression or anxiety  Mini Cog:  Patient Score: NEG    See patient instructions for recommendations.  Education and counseling regarding the above review of health provided with a plan for the following: -see scanned patient completed form for further details -fall prevention strategies discussed  -healthy lifestyle discussed -importance and resources for completing advanced directives discussed -see patient instructions below for any other recommendations provided  4)The following written screening schedule of preventive  measures were reviewed with assessment and plan made per below, orders and patient instructions:      AAA screening: n/a     Alcohol screening: done     Obesity Screening and counseling: done     STI screening: declined     Tobacco Screening: done       Pneumococcal (PPSV23 -one dose after 64, one before if risk factors), influenza yearly and hepatitis B vaccines (if high risk - end stage renal disease, IV drugs, homosexual men, live in home for mentally retarded, hemophilia receiving factors) ASSESSMENT/PLAN: refused      Screening mammograph (yearly if >40) ASSESSMENT/PLAN: yearly      Screening Pap smear/pelvic exam (q2 years) ASSESSMENT/PLAN: n/a      Prostate cancer screening ASSESSMENT/PLAN: n/a      Colorectal cancer screening (FOBT yearly or flex sig q4y or colonoscopy q10y or barium enema q4y) ASSESSMENT/PLAN: 6/20113      Diabetes outpatient self-management training services ASSESSMENT/PLAN: n/a      Bone mass measurements(covered q2y if indicated - estrogen def, osteoporosis, hyperparathyroid, vertebral abnormalities, osteoporosis or steroids) ASSESSMENT/PLAN: done 2015, repeat 2017, hx boniva tx, osteopenia      Screening for glaucoma(q1y if high risk - diabetes, FH, AA and > 50 or hispanic and > 65) ASSESSMENT/PLAN: done      Medical nutritional therapy for individuals with diabetes or renal disease ASSESSMENT/PLAN:n/a      Cardiovascular screening blood tests (lipids q5y) ASSESSMENT/PLAN: done 2015      Diabetes screening tests ASSESSMENT/PLAN: done 2015   7.) Summary: -risk factors and conditions per above assessment were discussed and treatment, recommendations and referrals were offered per documentation above and orders and patient instructions.  Medicare annual wellness visit, subsequent -see scanned and chart documentation  Atypical chest pain - Plan: Exercise Tolerance Test, Basic metabolic panel -suspect mild anxiety, will do stress test for her  concern -return precautions  Hypothyroidism, unspecified hypothyroidism type - Plan: TSH  Patient Instructions  BEFORE YOU LEAVE: -labs -follow up in 6-12 months  -We have ordered labs or studies at this visit. It can take up to 1-2 weeks for results and processing. We will contact you with instructions IF your results are abnormal. Normal results will be released to your [facility]. If you have not heard from [place] or can not find your results in [facility] in 2 weeks please contact our office.   We recommend the following healthy lifestyle measures: - eat a healthy diet consisting of lots of vegetables, fruits, beans, nuts, seeds, healthy meats such as white chicken and fish and whole grains.  - avoid fried foods, fast food, processed foods, sodas, red meet and other fattening foods.  - get a least 150 minutes of aerobic exercise per week.

## 2014-08-28 NOTE — Progress Notes (Signed)
Healthy patient with stable weight. Healthy habits advised.

## 2014-08-28 NOTE — Patient Instructions (Signed)
BEFORE YOU LEAVE: -labs -follow up in 6-12 months  -We have ordered labs or studies at this visit. It can take up to 1-2 weeks for results and processing. We will contact you with instructions IF your results are abnormal. Normal results will be released to your [facility]. If you have not heard from [place] or can not find your results in [facility] in 2 weeks please contact our office.   We recommend the following healthy lifestyle measures: - eat a healthy diet consisting of lots of vegetables, fruits, beans, nuts, seeds, healthy meats such as white chicken and fish and whole grains.  - avoid fried foods, fast food, processed foods, sodas, red meet and other fattening foods.  - get a least 150 minutes of aerobic exercise per week.

## 2014-08-29 MED ORDER — LEVOTHYROXINE SODIUM 75 MCG PO TABS: 75.0000 ug | ORAL_TABLET | Freq: Every day | ORAL | Status: DC

## 2014-08-29 NOTE — Addendum Note (Signed)
Addended by: Agnes Lawrence on: 08/29/2014 11:07 AM   Modules accepted: Orders

## 2014-08-29 NOTE — Addendum Note (Signed)
Addended by: Lahoma Crocker A on: 08/29/2014 11:10 AM   Modules accepted: Orders, Medications

## 2014-09-01 ENCOUNTER — Encounter: Payer: Self-pay | Admitting: Family Medicine

## 2014-09-23 ENCOUNTER — Encounter (HOSPITAL_COMMUNITY): Payer: Commercial Managed Care - HMO

## 2014-09-24 IMAGING — CR DG FOOT COMPLETE 3+V*R*
3 series · 3 of 3 positions shown · non-contrast
Comparison: 09/24/2013

CLINICAL DATA: Followup right foot fracture

EXAM:
RIGHT FOOT COMPLETE - 3+ VIEW

[view not recorded (1 of 3)]
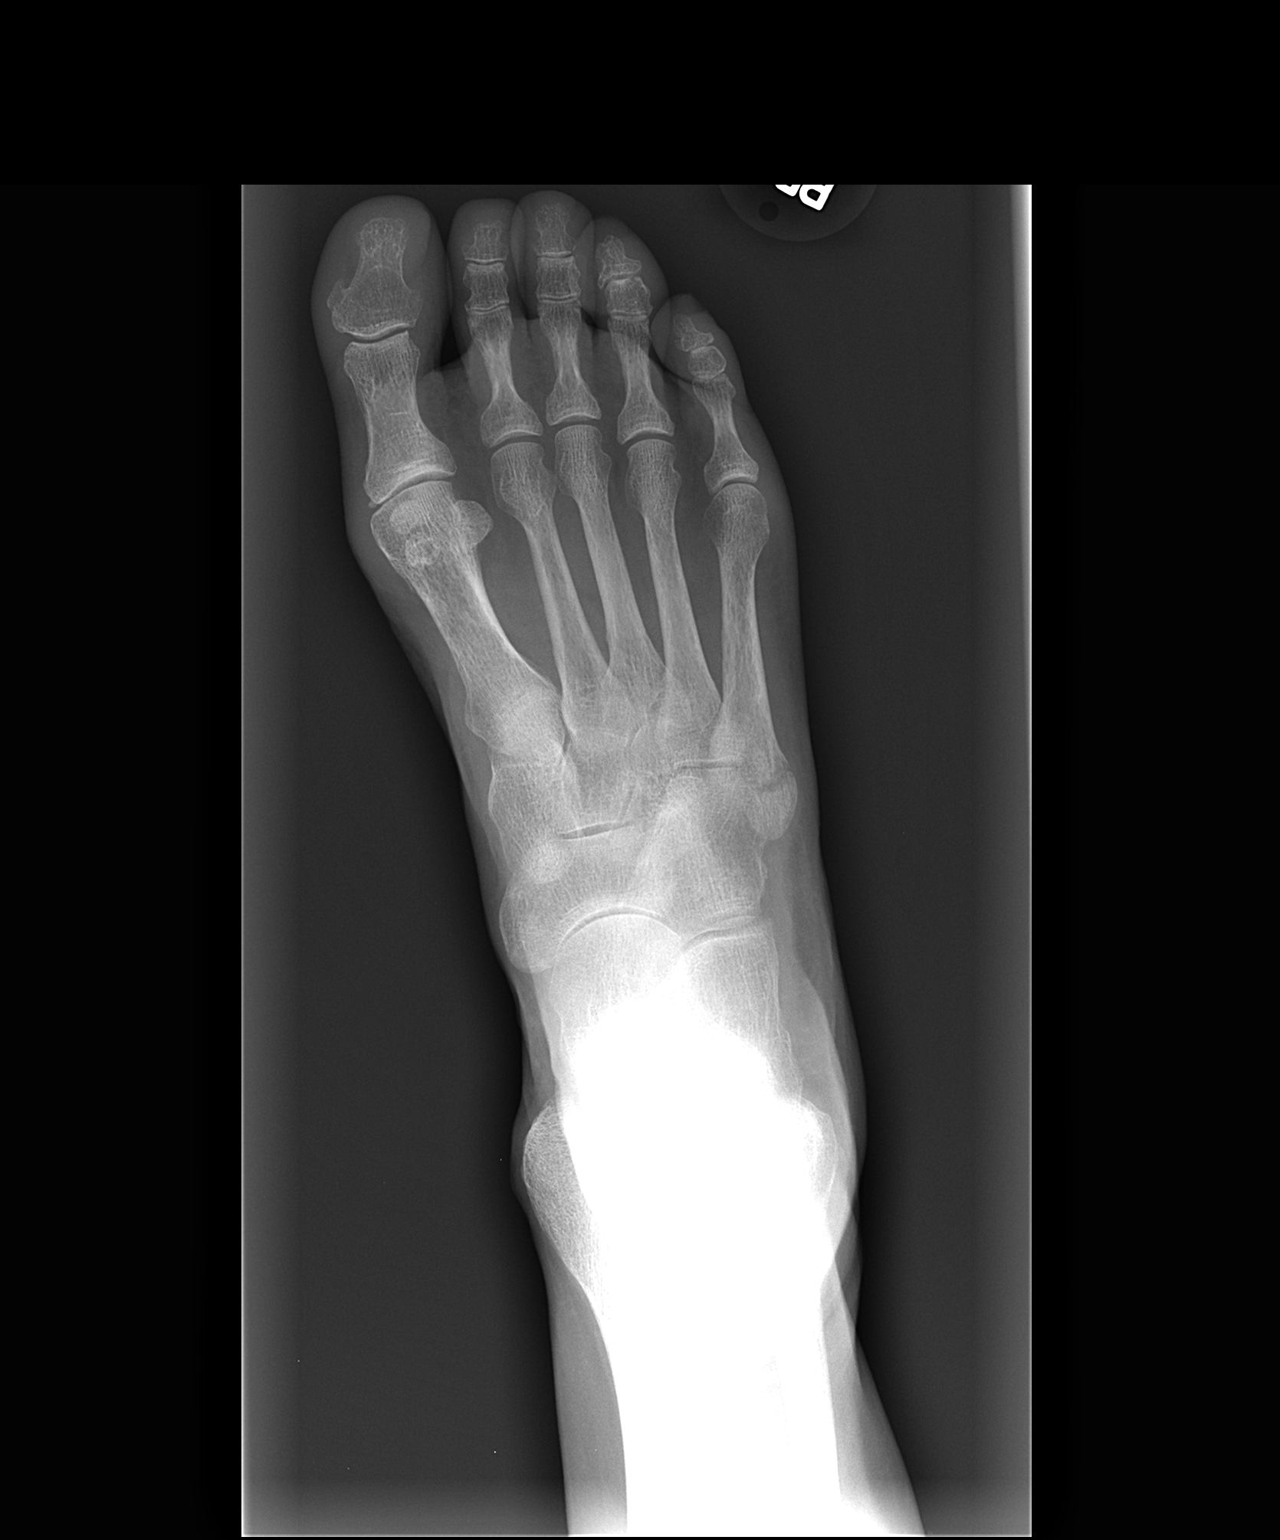

[view not recorded (2 of 3)]
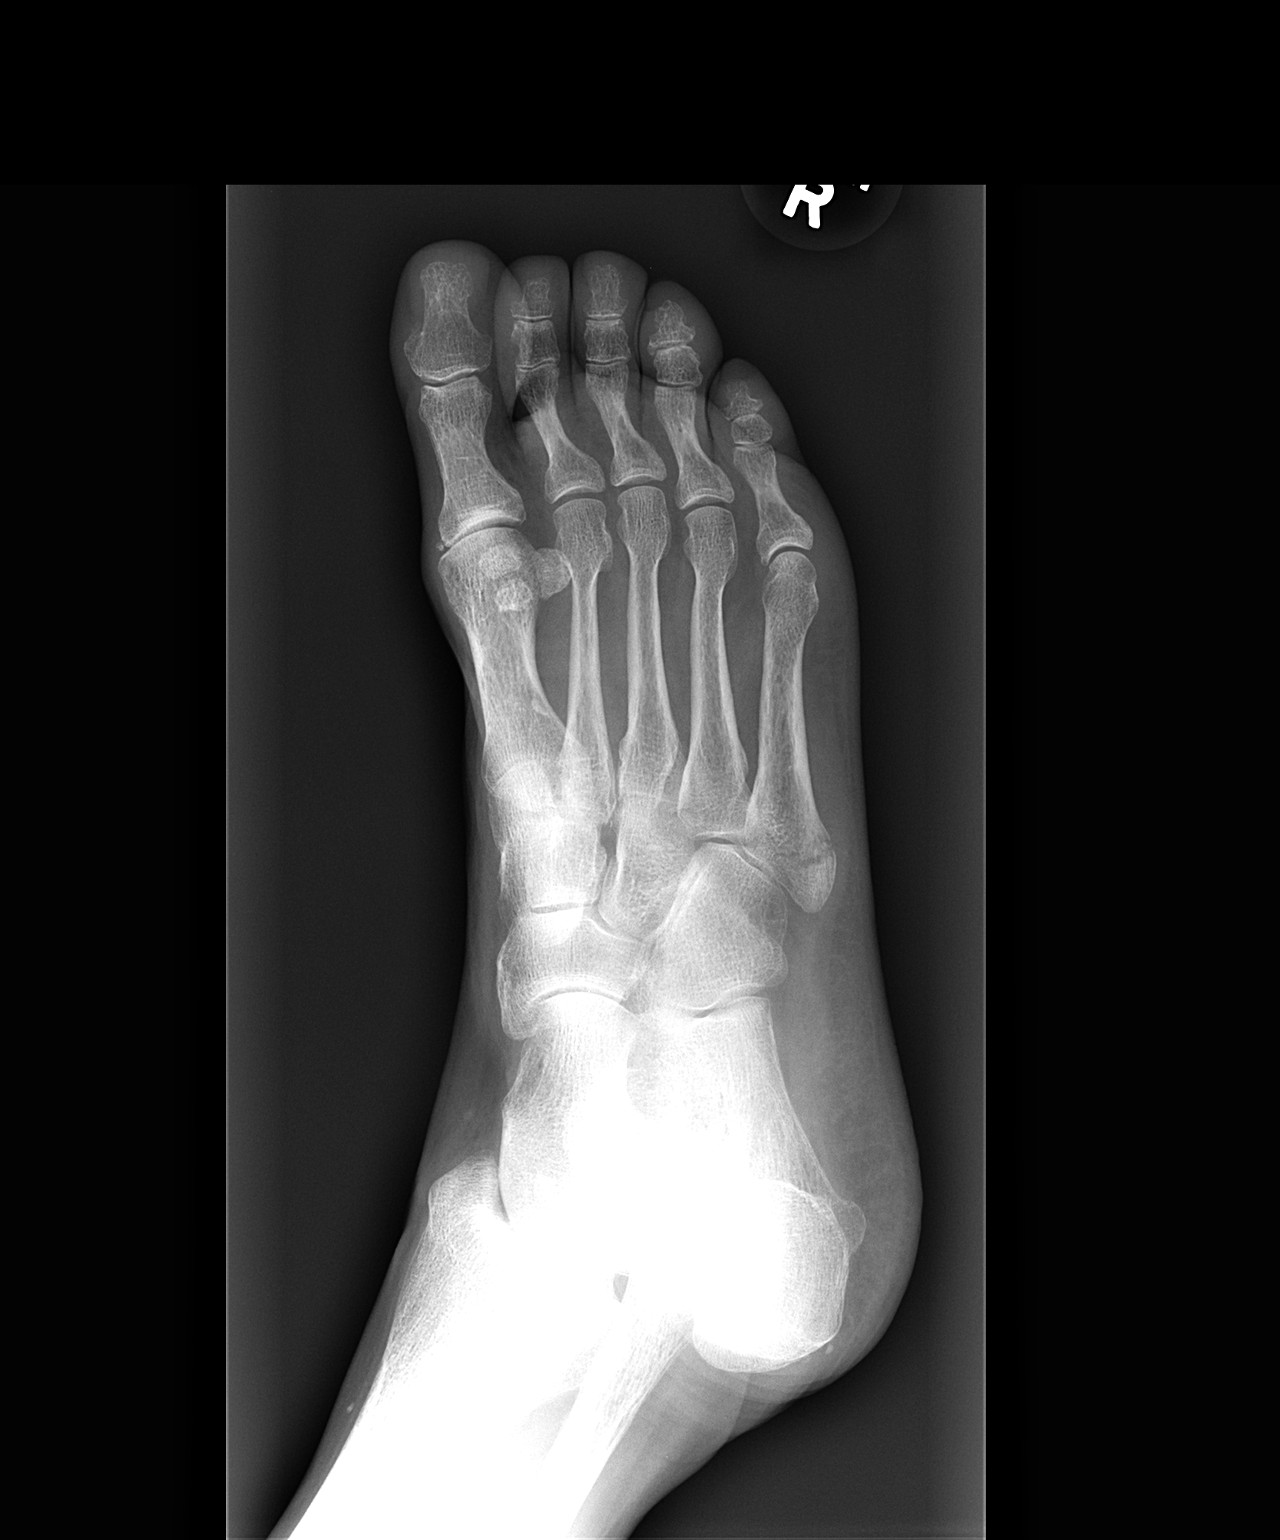

[view not recorded (3 of 3)]
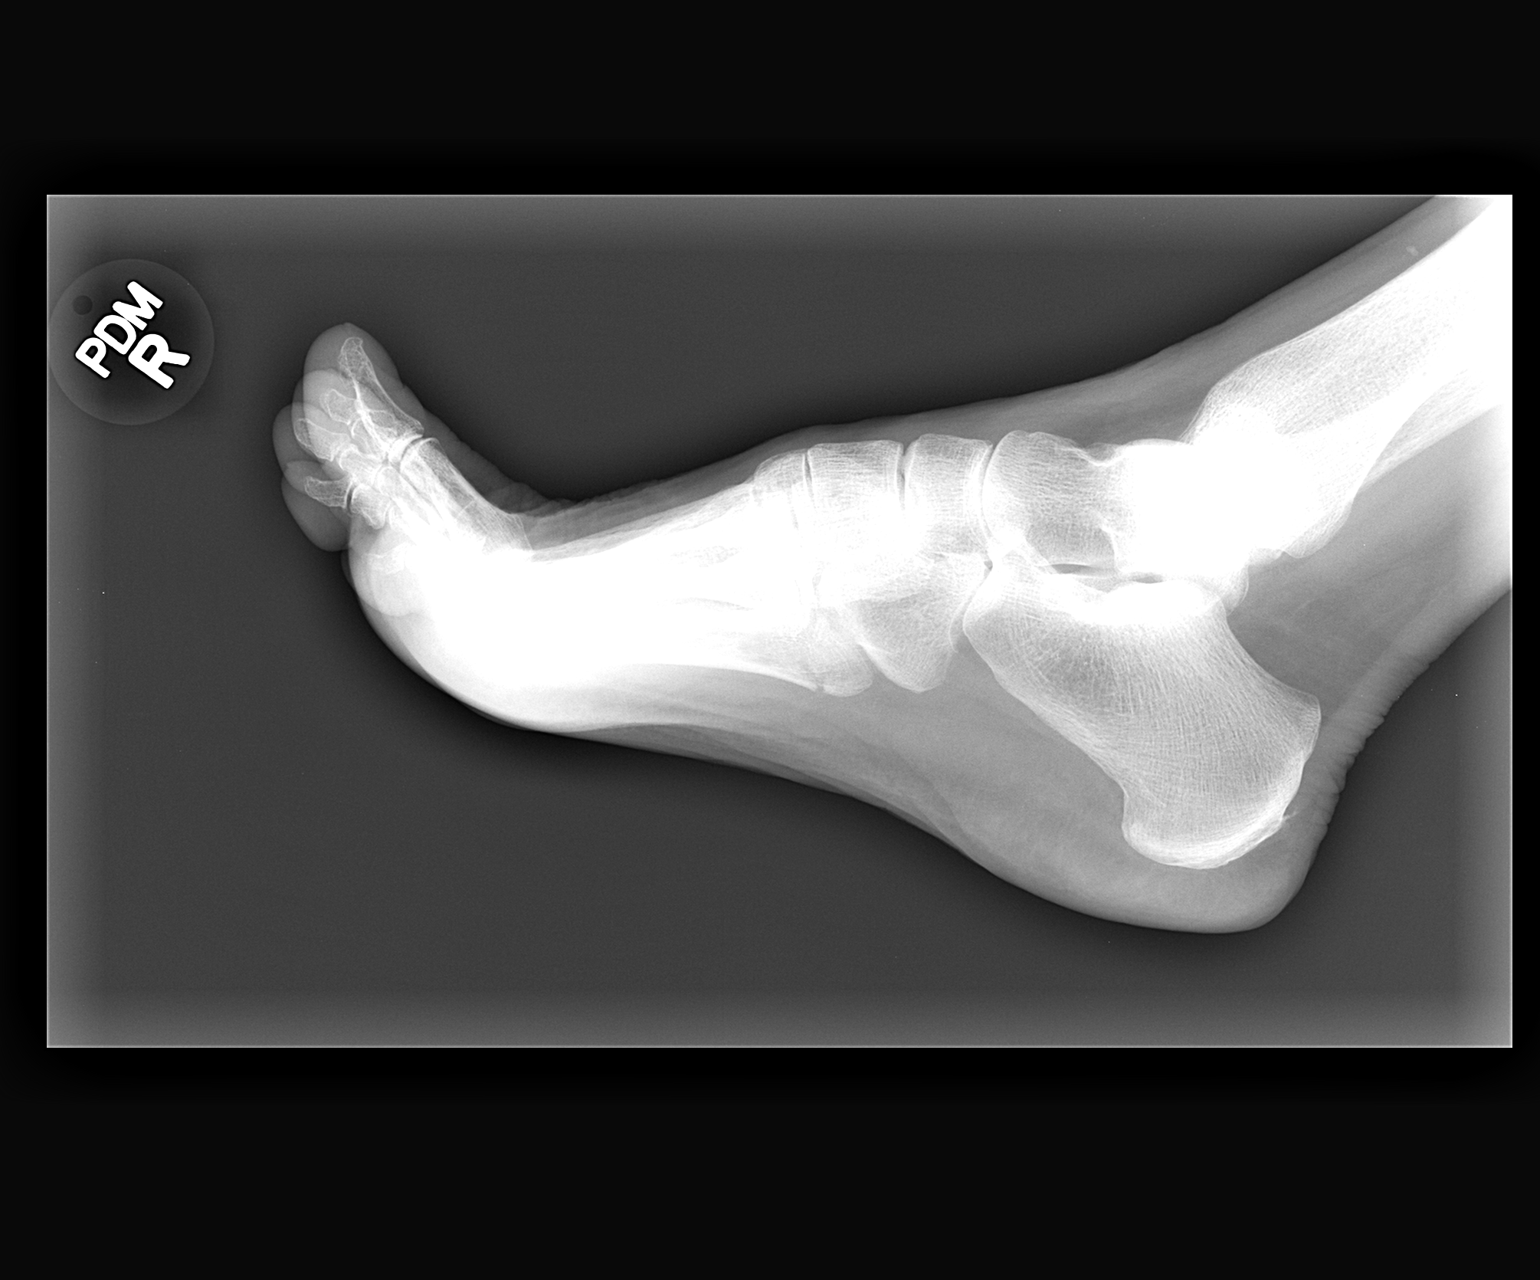

[3 of 3 positions shown; findings below may reference images not displayed]

FINDINGS: Minimally displaced fracture at the base of the fifth metatarsal
identified, not significantly different from the prior study. No
significant callus formation or healing is identified.
IMPRESSION: Unchanged fracture base of fifth metatarsal without evidence of
significant interval healing.

## 2014-10-09 ENCOUNTER — Other Ambulatory Visit (INDEPENDENT_AMBULATORY_CARE_PROVIDER_SITE_OTHER): Payer: Commercial Managed Care - HMO

## 2014-10-09 DIAGNOSIS — E039 Hypothyroidism, unspecified: Secondary | ICD-10-CM | POA: Diagnosis not present

## 2014-10-09 DIAGNOSIS — E875 Hyperkalemia: Secondary | ICD-10-CM | POA: Diagnosis not present

## 2014-10-09 LAB — BASIC METABOLIC PANEL
BUN: 14 mg/dL (ref 6–23)
CALCIUM: 9.3 mg/dL (ref 8.4–10.5)
CO2: 31 meq/L (ref 19–32)
Chloride: 101 mEq/L (ref 96–112)
Creatinine, Ser: 1.01 mg/dL (ref 0.40–1.20)
GFR: 57.32 mL/min — ABNORMAL LOW (ref >60.00)
GLUCOSE: 83 mg/dL (ref 70–99)
Potassium: 3.6 mEq/L (ref 3.5–5.1)
SODIUM: 138 meq/L (ref 135–145)

## 2014-10-09 LAB — TSH: TSH: 1 u[IU]/mL (ref 0.35–4.50)

## 2014-10-10 ENCOUNTER — Other Ambulatory Visit: Payer: Self-pay | Admitting: *Deleted

## 2014-10-10 ENCOUNTER — Encounter: Payer: Self-pay | Admitting: Family Medicine

## 2014-10-10 MED ORDER — LEVOTHYROXINE SODIUM 75 MCG PO TABS: 75.0000 ug | ORAL_TABLET | Freq: Every day | ORAL | Status: DC

## 2014-10-10 NOTE — Telephone Encounter (Signed)
Rx done and the pt was informed via Mychart message. 

## 2015-02-25 DIAGNOSIS — L718 Other rosacea: Secondary | ICD-10-CM | POA: Diagnosis not present

## 2015-03-09 DIAGNOSIS — H524 Presbyopia: Secondary | ICD-10-CM | POA: Diagnosis not present

## 2015-03-09 DIAGNOSIS — H5210 Myopia, unspecified eye: Secondary | ICD-10-CM | POA: Diagnosis not present

## 2015-03-09 DIAGNOSIS — Z0100 Encounter for examination of eyes and vision without abnormal findings: Secondary | ICD-10-CM | POA: Diagnosis not present

## 2015-03-11 ENCOUNTER — Other Ambulatory Visit: Payer: Self-pay | Admitting: Family Medicine

## 2015-07-21 ENCOUNTER — Other Ambulatory Visit: Payer: Self-pay | Admitting: Family Medicine

## 2015-07-27 ENCOUNTER — Telehealth: Payer: Self-pay | Admitting: Family Medicine

## 2015-07-27 MED ORDER — LEVOTHYROXINE SODIUM 75 MCG PO TABS: ORAL_TABLET | ORAL | Status: DC

## 2015-07-27 NOTE — Telephone Encounter (Signed)
Rx done. 

## 2015-07-27 NOTE — Telephone Encounter (Signed)
Patient need a refill of Levothyroxine send [place]

## 2015-09-21 ENCOUNTER — Telehealth: Payer: Self-pay

## 2015-09-21 DIAGNOSIS — Z1239 Encounter for other screening for malignant neoplasm of breast: Secondary | ICD-10-CM

## 2015-09-21 NOTE — Telephone Encounter (Signed)
Okay to order.  Thanks

## 2015-09-21 NOTE — Telephone Encounter (Signed)
Pt has made an appt with [facility] to have a Mammogram. Pt insurance requires a referral.   Referral has been entered and forwarding to Dr. Maudie Mercury for review/approval.

## 2015-09-21 NOTE — Telephone Encounter (Signed)
Signed per PCP below.

## 2015-09-23 DIAGNOSIS — Z1231 Encounter for screening mammogram for malignant neoplasm of breast: Secondary | ICD-10-CM | POA: Diagnosis not present

## 2015-09-23 LAB — HM MAMMOGRAPHY

## 2015-10-01 ENCOUNTER — Encounter: Payer: Self-pay | Admitting: Family Medicine

## 2015-11-18 DIAGNOSIS — H04123 Dry eye syndrome of bilateral lacrimal glands: Secondary | ICD-10-CM | POA: Diagnosis not present

## 2015-11-25 ENCOUNTER — Encounter: Payer: Commercial Managed Care - HMO | Admitting: Family Medicine

## 2015-11-26 ENCOUNTER — Ambulatory Visit (INDEPENDENT_AMBULATORY_CARE_PROVIDER_SITE_OTHER): Payer: Commercial Managed Care - HMO | Admitting: Family Medicine

## 2015-11-26 ENCOUNTER — Encounter: Payer: Self-pay | Admitting: Family Medicine

## 2015-11-26 VITALS — BP 90/60 | HR 56 | Temp 97.6°F | Ht 60.5 in | Wt 98.2 lb

## 2015-11-26 DIAGNOSIS — K52831 Collagenous colitis: Secondary | ICD-10-CM | POA: Diagnosis not present

## 2015-11-26 DIAGNOSIS — E039 Hypothyroidism, unspecified: Secondary | ICD-10-CM

## 2015-11-26 DIAGNOSIS — Z0000 Encounter for general adult medical examination without abnormal findings: Secondary | ICD-10-CM | POA: Diagnosis not present

## 2015-11-26 LAB — CBC WITH DIFFERENTIAL/PLATELET
BASOS ABS: 0 10*3/uL (ref 0.0–0.1)
BASOS PCT: 0.9 % (ref 0.0–3.0)
EOS PCT: 1.7 % (ref 0.0–5.0)
Eosinophils Absolute: 0.1 10*3/uL (ref 0.0–0.7)
HEMATOCRIT: 39.5 % (ref 36.0–46.0)
Hemoglobin: 13.1 g/dL (ref 12.0–15.0)
LYMPHS ABS: 1.6 10*3/uL (ref 0.7–4.0)
LYMPHS PCT: 34.8 % (ref 12.0–46.0)
MCHC: 33.3 g/dL (ref 30.0–36.0)
MCV: 89.1 fl (ref 78.0–100.0)
MONOS PCT: 9.4 % (ref 3.0–12.0)
Monocytes Absolute: 0.4 10*3/uL (ref 0.1–1.0)
NEUTROS ABS: 2.4 10*3/uL (ref 1.4–7.7)
NEUTROS PCT: 53.2 % (ref 43.0–77.0)
PLATELETS: 271 10*3/uL (ref 150.0–400.0)
RBC: 4.43 Mil/uL (ref 3.87–5.11)
RDW: 13.7 % (ref 11.5–15.5)
WBC: 4.5 10*3/uL (ref 4.0–10.5)

## 2015-11-26 LAB — LIPID PANEL
CHOL/HDL RATIO: 4
Cholesterol: 249 mg/dL — ABNORMAL HIGH (ref 0–200)
HDL: 67.7 mg/dL (ref >39.00)
LDL CALC: 161 mg/dL — AB (ref 0–99)
NONHDL: 181.07
TRIGLYCERIDES: 98 mg/dL (ref 0.0–149.0)
VLDL: 19.6 mg/dL (ref 0.0–40.0)

## 2015-11-26 LAB — HEMOGLOBIN A1C: Hgb A1c MFr Bld: 5.6 % (ref 4.6–6.5)

## 2015-11-26 LAB — TSH: TSH: 0.26 u[IU]/mL — ABNORMAL LOW (ref 0.35–4.50)

## 2015-11-26 NOTE — Progress Notes (Signed)
Pre visit review using our clinic review tool, if applicable. No additional management support is needed unless otherwise documented below in the visit note. 

## 2015-11-26 NOTE — Progress Notes (Addendum)
Medicare Annual Preventive Care Visit  (initial annual wellness or annual wellness exam)  Concerns and/or follow up today:  Diane Becker is a 75 yo here for her medicare wellness exam and Physical today. She specifically request both and reports she scheduled extra time for this.  Hypothyroidism: -stable on synthroid -feel better, increased energy -denies: changes in weight, changes in bowels, skin changes  Collagenous Colitis: -managed by her gastroenterologist, Dr. Collene Mares -reports: changes in bowels, abd pain, blood in stools -reports gets colonoscopy every 5 years -occ gas, sometimes in RUQ, she plans to discuss with Dr. Collene Mares  ROS: negative for report of fevers, unintentional weight loss, vision changes, vision loss, hearing loss or change, chest pain, sob, hemoptysis, melena, hematochezia, hematuria, genital discharge or lesions, falls, bleeding or bruising, loc, thoughts of suicide or self harm, memory loss  1.) Patient-completed health risk assessment  - completed and reviewed, see scanned documentation  2.) Review of Medical History: -PMH, PSH, Family History and current specialty and care providers reviewed and updated and listed below  - see scanned in document in chart and below  Past Medical History  Diagnosis Date  . Colon polyps   . Thyroid disease   . Rosacea     sees dermatologist Dr. Ubaldo Glassing for this  . Colitis, collagenous     Followed by Dr. Collene Mares at [facility] in GI  . Osteopenia     on > 5 years of bisphosphonate in the past    Past Surgical History  Procedure Laterality Date  . Abdominal hysterectomy  1984  . Thyroid surgery  2009    Dr. Margot Chimes   . Abdominal hysterectomy      for menorrhagia, no cervix per pt    Social History   Social History  . Marital Status: Married    Spouse Name: N/A  . Number of Children: N/A  . Years of Education: N/A   Occupational History  . Not on file.   Social History Main Topics  . Smoking status: Never  Smoker   . Smokeless tobacco: Not on file  . Alcohol Use: No  . Drug Use: Not on file  . Sexual Activity: Not on file   Other Topics Concern  . Not on file   Social History Narrative    Family History  Problem Relation Age of Onset  . Colon cancer      Current Outpatient Prescriptions on File Prior to Visit  Medication Sig Dispense Refill  . aspirin 81 MG tablet Take 81 mg by mouth every Monday, Wednesday, and Friday.     . Azelaic Acid (FINACEA) 15 % cream Apply topically as needed. After skin is thoroughly washed and patted dry, gently but thoroughly massage a thin film of azelaic acid cream into the affected area twice daily, in the morning and evening.    Marland Kitchen BIOTIN 5000 PO Take by mouth daily.    . budesonide (ENTOCORT EC) 3 MG 24 hr capsule Take 2 capsules (6 mg total) by mouth 3 (three) times daily. 90 capsule 10  . calcium-vitamin D (OSCAL WITH D) 500-200 MG-UNIT per tablet Take 1 tablet by mouth daily.    Marland Kitchen levothyroxine (SYNTHROID, LEVOTHROID) 75 MCG tablet TAKE 1 TABLET ONE TIME DAILY 90 tablet 0  . Probiotic Product (PROBIOTIC DAILY PO) Take by mouth daily.    Marland Kitchen tetracycline (ACHROMYCIN,SUMYCIN) 500 MG capsule Take 500 mg by mouth. Every 4 days     No current facility-administered medications on file prior to  visit.     3.) Review of functional ability and level of safety:  Any difficulty hearing?  NO  History of falling?  NO  Any trouble with IADLs - using a phone, using transportation, grocery shopping, preparing meals, doing housework, doing laundry, taking medications and managing money? NO  Advance Directives? Yes - agrees to bring copy  See summary of recommendations in Patient Instructions below.  4.) Physical Exam Filed Vitals:   11/26/15 0655  BP: 90/60  Pulse: 56  Temp: 97.6 F (36.4 C)   Estimated body mass index is 18.86 kg/(m^2) as calculated from the following:   Height as of this encounter: 5' 0.5" (1.537 m).   Weight as of this  encounter: 98 lb 3.2 oz (44.543 kg).  EKG (optional): deferred  GENERAL: vitals reviewed and listed below, alert, oriented, appears well hydrated and in no acute distress  HEENT: head atraumatic, PERRLA, normal appearance of eyes, ears, nose and mouth. Small amount of soft wax in the L ear canal. moist mucus membranes. Good dental hygiene.  NECK: supple, no masses or lymphadenopathy  LUNGS: clear to auscultation bilaterally, no rales, rhonchi or wheeze  CV: HRRR, no peripheral edema or cyanosis, normal pedal pulses  BREAST: declined  ABDOMEN: bowel sounds normal, soft, non tender to palpation, no masses, no rebound or guarding  GU: declined   SKIN: no rash or abnormal lesions on exposed regions, declined full skin exam  MS: normal gait, moves all extremities normally  NEURO: CN II-XII grossly intact, normal muscle tone and sensation to light touch on extremities  Psych: pleasant and cooperative, no obvious depression or anxiety  Cognitive function grossly intact  See patient instructions for recommendations.  Education and counseling regarding the above review of health provided with a plan for the following: -see scanned patient completed form for further details -fall prevention strategies discussed  -healthy lifestyle discussed -importance and resources for completing advanced directives discussed -see patient instructions below for any other recommendations provided  4)The following written screening schedule of preventive measures were reviewed with assessment and plan made per below, orders and patient instructions:      AAA screening done if applicable     Alcohol screening done     Obesity Screening and counseling done     STI screening (Hep C if born [birth year]-65) offered and per pt wishes     Tobacco Screening done        Pneumococcal (PPSV23 -one dose after 64, one before if risk factors), influenza yearly and hepatitis B vaccines (if high risk - end stage renal  disease, IV drugs, homosexual men, live in home for mentally retarded, hemophilia receiving factors) ASSESSMENT/PLAN: refused      Screening mammograph (yearly if >40) ASSESSMENT/PLAN: utd       Screening Pap smear/pelvic exam (q2 years) ASSESSMENT/PLAN: n/a, declined      Colorectal cancer screening (FOBT yearly or flex sig q4y or colonoscopy q10y or barium enema q4y) ASSESSMENT/PLAN: utd; asked assistant to obtain copy      Diabetes outpatient self-management training services ASSESSMENT/PLAN: utd      Bone mass measurements(covered q2y if indicated - estrogen def, osteoporosis, hyperparathyroid, vertebral abnormalities, osteoporosis or steroids) ASSESSMENT/PLAN: utd       Screening for glaucoma(q1y if high risk - diabetes, FH, AA and > 50 or hispanic and > 65) ASSESSMENT/PLAN: utd or advised      Medical nutritional therapy for individuals with diabetes or renal disease ASSESSMENT/PLAN: see orders  Cardiovascular screening blood tests (lipids q5y) ASSESSMENT/PLAN: see orders and labs      Diabetes screening tests ASSESSMENT/PLAN: see orders and labs   7.) Summary:  1. Medicare annual wellness visit, subsequent -risk factors and conditions per above assessment were discussed and treatment, recommendations and referrals were offered per documentation above and orders and patient instructions.  2. Physical exam -USPSTF recs a/b discussed -fasting labs - Lipid Panel - Hemoglobin A1c  3. Hypothyroidism, unspecified hypothyroidism type - TSH -adjust or cont synthroid accordingly  4. Colitis, collagenous - followed by Dr. Collene Mares at [facility] -will get some extra labs today given complain occ "gas" or discomfort and advised GI follow up if seems different then chronic symptoms - CMP with eGFR - CBC with Differential/Platelets   Patient Instructions  BEFORE YOU LEAVE: -labs -physical in 1 year  Vit D3 [phone] IU; adequate dietary intake of calcium  (1255m)  Please bring copy of advanced directives to our office  Mammogram yearly  Colonoscopy - follow Dr. MLorie Apleyadvice  We recommend the following healthy lifestyle measures: - eat a healthy whole foods diet consisting of regular small meals composed of vegetables, fruits, beans, nuts, seeds, healthy meats such as white chicken and fish and whole grains.  - avoid sweets, white starchy foods, fried foods, fast food, processed foods, sodas, red meet and other fattening foods.  - get a least 150-300 minutes of aerobic exercise per week.   We have ordered labs or studies at this visit. It can take up to 1-2 weeks for results and processing. IF results require follow up or explanation, we will call you with instructions. Clinically stable results will be [facility] your [facility] If you have not heard from [place] cannot find your results in [facility] 2 weeks please contact our office at [phone]  If you are not yet [facility] please consider signing up.           KColin BentonR., DO

## 2015-11-26 NOTE — Patient Instructions (Addendum)
BEFORE YOU LEAVE: -labs -physical in 1 year  Vit D3 [phone] IU; adequate dietary intake of calcium (1200mg )  Please bring copy of advanced directives to our office  Mammogram yearly  Colonoscopy - follow Dr. Lorie Apley advice  We recommend the following healthy lifestyle measures: - eat a healthy whole foods diet consisting of regular small meals composed of vegetables, fruits, beans, nuts, seeds, healthy meats such as white chicken and fish and whole grains.  - avoid sweets, white starchy foods, fried foods, fast food, processed foods, sodas, red meet and other fattening foods.  - get a least 150-300 minutes of aerobic exercise per week.   We have ordered labs or studies at this visit. It can take up to 1-2 weeks for results and processing. IF results require follow up or explanation, we will call you with instructions. Clinically stable results will be released to your [facility]. If you have not heard from [place] or cannot find your results in [facility] in 2 weeks please contact our office at [phone].  If you are not yet signed up for [facility], please consider signing up.

## 2015-11-27 LAB — COMPLETE METABOLIC PANEL WITH GFR
ALT: 12 U/L (ref 6–29)
AST: 17 U/L (ref 10–35)
Albumin: 4 g/dL (ref 3.6–5.1)
Alkaline Phosphatase: 59 U/L (ref 33–130)
BUN: 18 mg/dL (ref 7–25)
CALCIUM: 9.1 mg/dL (ref 8.6–10.4)
CHLORIDE: 103 mmol/L (ref 98–110)
CO2: 26 mmol/L (ref 20–31)
Creat: 1.04 mg/dL — ABNORMAL HIGH (ref 0.60–0.93)
GFR, EST AFRICAN AMERICAN: 62 mL/min (ref >=60)
GFR, Est Non African American: 54 mL/min — ABNORMAL LOW (ref >=60)
Glucose, Bld: 85 mg/dL (ref 65–99)
POTASSIUM: 4 mmol/L (ref 3.5–5.3)
Sodium: 138 mmol/L (ref 135–146)
Total Bilirubin: 0.6 mg/dL (ref 0.2–1.2)
Total Protein: 6.4 g/dL (ref 6.1–8.1)

## 2015-12-04 NOTE — Addendum Note (Signed)
Addended by: Agnes Lawrence on: 12/04/2015 03:28 PM   Modules accepted: Orders

## 2016-01-18 ENCOUNTER — Other Ambulatory Visit (INDEPENDENT_AMBULATORY_CARE_PROVIDER_SITE_OTHER): Payer: Commercial Managed Care - HMO

## 2016-01-18 DIAGNOSIS — E039 Hypothyroidism, unspecified: Secondary | ICD-10-CM | POA: Diagnosis not present

## 2016-01-18 LAB — TSH: TSH: 0.33 u[IU]/mL — ABNORMAL LOW (ref 0.35–4.50)

## 2016-01-19 ENCOUNTER — Encounter: Payer: Self-pay | Admitting: Family Medicine

## 2016-01-20 ENCOUNTER — Encounter: Payer: Self-pay | Admitting: Family Medicine

## 2016-01-21 MED ORDER — LEVOTHYROXINE SODIUM 75 MCG PO TABS: ORAL_TABLET | ORAL | 0 refills | Status: DC

## 2016-01-21 NOTE — Addendum Note (Signed)
Addended by: Agnes Lawrence on: 01/21/2016 02:14 PM   Modules accepted: Orders

## 2016-01-21 NOTE — Addendum Note (Signed)
Addended by: Agnes Lawrence on: 01/21/2016 10:02 AM   Modules accepted: Orders

## 2016-01-29 ENCOUNTER — Other Ambulatory Visit: Payer: Self-pay | Admitting: *Deleted

## 2016-01-29 ENCOUNTER — Encounter: Payer: Self-pay | Admitting: Family Medicine

## 2016-01-29 DIAGNOSIS — L719 Rosacea, unspecified: Secondary | ICD-10-CM

## 2016-01-29 NOTE — Progress Notes (Signed)
Order entered and pt was notified via Mychart message.

## 2016-03-07 ENCOUNTER — Ambulatory Visit (INDEPENDENT_AMBULATORY_CARE_PROVIDER_SITE_OTHER): Payer: Commercial Managed Care - HMO | Admitting: Emergency Medicine

## 2016-03-07 DIAGNOSIS — Z23 Encounter for immunization: Secondary | ICD-10-CM | POA: Diagnosis not present

## 2016-03-30 DIAGNOSIS — L661 Lichen planopilaris: Secondary | ICD-10-CM | POA: Diagnosis not present

## 2016-03-30 DIAGNOSIS — L718 Other rosacea: Secondary | ICD-10-CM | POA: Diagnosis not present

## 2016-04-04 DIAGNOSIS — H5210 Myopia, unspecified eye: Secondary | ICD-10-CM | POA: Diagnosis not present

## 2016-04-18 ENCOUNTER — Encounter: Payer: Self-pay | Admitting: Family Medicine

## 2016-04-18 ENCOUNTER — Other Ambulatory Visit: Payer: Self-pay | Admitting: *Deleted

## 2016-04-18 DIAGNOSIS — E2839 Other primary ovarian failure: Secondary | ICD-10-CM

## 2016-04-18 NOTE — Progress Notes (Signed)
Order entered and the pt was informed via Mychart message.

## 2016-04-26 ENCOUNTER — Other Ambulatory Visit (INDEPENDENT_AMBULATORY_CARE_PROVIDER_SITE_OTHER): Payer: Commercial Managed Care - HMO

## 2016-04-26 DIAGNOSIS — E039 Hypothyroidism, unspecified: Secondary | ICD-10-CM

## 2016-04-26 LAB — TSH: TSH: 1.66 u[IU]/mL (ref 0.35–4.50)

## 2016-04-28 DIAGNOSIS — M8589 Other specified disorders of bone density and structure, multiple sites: Secondary | ICD-10-CM | POA: Diagnosis not present

## 2016-04-28 LAB — HM DEXA SCAN

## 2016-05-06 ENCOUNTER — Encounter: Payer: Self-pay | Admitting: Family Medicine

## 2016-05-09 ENCOUNTER — Other Ambulatory Visit: Payer: Self-pay | Admitting: *Deleted

## 2016-05-09 MED ORDER — LEVOTHYROXINE SODIUM 75 MCG PO TABS: ORAL_TABLET | ORAL | 0 refills | Status: DC

## 2016-05-10 ENCOUNTER — Encounter: Payer: Self-pay | Admitting: Family Medicine

## 2016-08-22 ENCOUNTER — Other Ambulatory Visit: Payer: Self-pay | Admitting: *Deleted

## 2016-08-22 ENCOUNTER — Encounter: Payer: Self-pay | Admitting: Family Medicine

## 2016-08-22 MED ORDER — LEVOTHYROXINE SODIUM 75 MCG PO TABS: ORAL_TABLET | ORAL | 1 refills | Status: DC

## 2016-08-22 NOTE — Telephone Encounter (Signed)
Rx done and the pt was notified via Mychart message. 

## 2016-11-28 NOTE — Progress Notes (Signed)
HPI:  Here for CPE/Follow up with me. Seeing health coach for her AWV today as well.   -Concerns and/or follow up today:  PMH significant for Hypothyroidism and collagenous colitis. Feels thyroid has been ok - energy good. She has always bee underweight and eats healthy, but also eats some butter and carbs to try to keep wt on - unchanged. She sees Dr. Collene Mares for there colitis and has a colonoscopy planned for next week. Is staying active - walks daily, gardens. Had hyperlipidemia in the past.  -Taking folic acid, vitamin D or calcium: taking 1000 IU VIt D3 daily  -Diabetes and Dyslipidemia Screening: Fasting for labs  -Vaccines: refuses pneumonia vaccines  -pap history: s/p hysterectomy, benign  -sexual activity: yes, female partner, no new partners  -wants STI testing (Hep C if born [birth year]): no  -FH breast, colon or ovarian ca: see FH Last mammogram: has decided to do mammograms every 2 years, does self breast exams, declines CBE Last colon cancer screening: sees Dr. Collene Mares  -Alcohol, Tobacco, drug use: see social history  Review of Systems - no fevers, unintentional weight loss, vision loss, hearing loss, chest pain, sob, hemoptysis, melena, hematochezia, hematuria, genital discharge, changing or concerning skin lesions, bleeding, bruising, loc, thoughts of self harm or SI  Past Medical History:  Diagnosis Date  . Colitis, collagenous    Followed by Dr. Collene Mares at [facility] in GI  . Colon polyps   . Osteopenia    on > 5 years of bisphosphonate in the past  . Rosacea    sees dermatologist Dr. Ubaldo Glassing for this  . Thyroid disease     Past Surgical History:  Procedure Laterality Date  . ABDOMINAL HYSTERECTOMY  1984  . ABDOMINAL HYSTERECTOMY     for menorrhagia, no cervix per pt  . THYROID SURGERY  2009   Dr. Margot Chimes     Family History  Problem Relation Age of Onset  . Colon cancer Unknown     Social History   Social History  . Marital status: Married    Spouse  name: N/A  . Number of children: N/A  . Years of education: N/A   Social History Main Topics  . Smoking status: Never Smoker  . Smokeless tobacco: Never Used  . Alcohol use No  . Drug use: Unknown  . Sexual activity: Not Asked   Other Topics Concern  . None   Social History Narrative  . None     Current Outpatient Prescriptions:  .  aspirin 81 MG tablet, Take 81 mg by mouth every Monday, Wednesday, and Friday. , Disp: , Rfl:  .  Azelaic Acid (FINACEA) 15 % cream, Apply topically as needed. After skin is thoroughly washed and patted dry, gently but thoroughly massage a thin film of azelaic acid cream into the affected area twice daily, in the morning and evening., Disp: , Rfl:  .  BIOTIN 5000 PO, Take by mouth daily., Disp: , Rfl:  .  Cholecalciferol (VITAMIN D3 PO), Take 1,000 Units by mouth daily., Disp: , Rfl:  .  levothyroxine (SYNTHROID, LEVOTHROID) 75 MCG tablet, Take 1 tablet daily before breakfast, Disp: 90 tablet, Rfl: 1 .  Probiotic Product (PROBIOTIC DAILY PO), Take by mouth daily., Disp: , Rfl:  .  tetracycline (ACHROMYCIN,SUMYCIN) 500 MG capsule, Take 500 mg by mouth. Every 4 days, Disp: , Rfl:   EXAM:  Vitals:   11/29/16 0735  BP: 100/60  Pulse: 64  Temp: 98.4 F (36.9 C)  GENERAL: vitals reviewed and listed below, alert, oriented, appears well hydrated and in no acute distress  HEENT: head atraumatic, PERRLA, normal appearance of eyes, ears, nose and mouth. moist mucus membranes.  NECK: supple, no masses or lymphadenopathy  LUNGS: clear to auscultation bilaterally, no rales, rhonchi or wheeze  CV: HRRR, no peripheral edema or cyanosis, normal pedal pulses  ABDOMEN: bowel sounds normal, soft, non tender to palpation, no masses, no rebound or guarding  SKIN: no rash or abnormal lesions - declined full skin exam - does with Dr. Ubaldo Glassing, dermatologist  GU/BREAST: declined  MS: normal gait, moves all extremities normally  NEURO: normal gait, speech and  thought processing grossly intact, muscle tone grossly intact throughout  PSYCH: normal affect, pleasant and cooperative  ASSESSMENT AND PLAN:  Discussed the following assessment and plan:  Hypothyroidism, unspecified type - Plan: TSH -check labs, adjust tx if needed  Hyperlipidemia, unspecified hyperlipidemia type - Plan: Lipid panel -labs check, prefers to treat with lifestyle  Colitis, collagenous - followed by Dr. Collene Mares at [place]: Basic metabolic panel, CBC -managed by Dr. Collene Mares  Rosacea - followed by Dr. Ubaldo Glassing   Underweight -stable -discussed healthy diet  Encounter for preventive health examination -Discussed and advised all [place] preventive services health task force level A and B recommendations for age, sex and risks. -she declines vaccines -wants to do mammo every 2 years -Advised at least 150 minutes of exercise per week and a healthy diet with avoidance of (less then 1 serving per week) processed foods, white starches, red meat, fast foods and sweets and consisting of: * 5-9 servings of fresh fruits and vegetables (not corn or potatoes) *nuts and seeds, beans *olives and olive oil *lean meats such as fish and white chicken  *whole grains -labs, studies and vaccines per orders this encounter  Medicare annual wellness visit, subsequent -see health coach notes  Screening for depression -negative   Orders Placed This Encounter  Procedures  . TSH  . Lipid panel  . Basic metabolic panel  . CBC    Patient advised to return to clinic immediately if symptoms worsen or persist or new concerns.  Patient Instructions  BEFORE YOU LEAVE: -follow up: yearly with Dr. Maudie Mercury for follow up and with health coach for wellness visit on the same day -labs  Please bring a copy of your advanced directives to your next visit.  Get your mammogram every 1-2 years and do self breast exams.  See Dr. Collene Mares for your colonoscopy.  We have ordered labs or studies at  this visit. It can take up to 1-2 weeks for results and processing. IF results require follow up or explanation, we will call you with instructions. Clinically stable results will be released to your [facility]. If you have not heard from [place] or cannot find your results in [facility] in 2 weeks please contact our office at [phone].  If you are not yet signed up for [facility], please consider signing up.  WE NOW OFFER   [facility] FAST TRACK!!!  SAME DAY Appointments for ACUTE CARE  Such as: Sprains, Injuries, cuts, abrasions, rashes, muscle pain, joint pain, back pain Colds, flu, sore throats, headache, allergies, cough, fever  Ear pain, sinus and eye infections Abdominal pain, nausea, vomiting, diarrhea, upset stomach Animal/insect bites  3 Easy Ways to Schedule: Walk-In Scheduling Call in scheduling Mychart Sign-up: [email or web address]                No Follow-up on file.  Colin Benton R., DO

## 2016-11-29 ENCOUNTER — Ambulatory Visit (INDEPENDENT_AMBULATORY_CARE_PROVIDER_SITE_OTHER): Payer: Medicare HMO | Admitting: Family Medicine

## 2016-11-29 ENCOUNTER — Encounter: Payer: Self-pay | Admitting: Family Medicine

## 2016-11-29 VITALS — BP 100/60 | HR 64 | Temp 98.4°F | Ht 60.25 in | Wt 96.2 lb

## 2016-11-29 DIAGNOSIS — L719 Rosacea, unspecified: Secondary | ICD-10-CM | POA: Diagnosis not present

## 2016-11-29 DIAGNOSIS — Z0000 Encounter for general adult medical examination without abnormal findings: Secondary | ICD-10-CM | POA: Diagnosis not present

## 2016-11-29 DIAGNOSIS — E039 Hypothyroidism, unspecified: Secondary | ICD-10-CM

## 2016-11-29 DIAGNOSIS — K52831 Collagenous colitis: Secondary | ICD-10-CM

## 2016-11-29 DIAGNOSIS — E785 Hyperlipidemia, unspecified: Secondary | ICD-10-CM

## 2016-11-29 DIAGNOSIS — R636 Underweight: Secondary | ICD-10-CM

## 2016-11-29 DIAGNOSIS — Z1389 Encounter for screening for other disorder: Secondary | ICD-10-CM

## 2016-11-29 DIAGNOSIS — Z1331 Encounter for screening for depression: Secondary | ICD-10-CM

## 2016-11-29 LAB — CBC
HCT: 39.9 % (ref 36.0–46.0)
Hemoglobin: 13.7 g/dL (ref 12.0–15.0)
MCHC: 34.3 g/dL (ref 30.0–36.0)
MCV: 91 fl (ref 78.0–100.0)
Platelets: 275 10*3/uL (ref 150.0–400.0)
RBC: 4.38 Mil/uL (ref 3.87–5.11)
RDW: 13.3 % (ref 11.5–15.5)
WBC: 7.4 10*3/uL (ref 4.0–10.5)

## 2016-11-29 LAB — LIPID PANEL
CHOL/HDL RATIO: 3
Cholesterol: 248 mg/dL — ABNORMAL HIGH (ref 0–200)
HDL: 81.1 mg/dL (ref >39.00)
LDL CALC: 152 mg/dL — AB (ref 0–99)
NONHDL: 166.81
Triglycerides: 73 mg/dL (ref 0.0–149.0)
VLDL: 14.6 mg/dL (ref 0.0–40.0)

## 2016-11-29 LAB — BASIC METABOLIC PANEL
BUN: 15 mg/dL (ref 6–23)
CALCIUM: 9.4 mg/dL (ref 8.4–10.5)
CO2: 28 mEq/L (ref 19–32)
CREATININE: 1.07 mg/dL (ref 0.40–1.20)
Chloride: 103 mEq/L (ref 96–112)
GFR: 53.31 mL/min — ABNORMAL LOW (ref >60.00)
Glucose, Bld: 89 mg/dL (ref 70–99)
Potassium: 3.8 mEq/L (ref 3.5–5.1)
Sodium: 139 mEq/L (ref 135–145)

## 2016-11-29 LAB — TSH: TSH: 0.63 u[IU]/mL (ref 0.35–4.50)

## 2016-11-29 NOTE — Progress Notes (Signed)
Subjective:   Diane Becker is a 76 y.o. female who presents for Medicare Annual (Subsequent) preventive examination.  Review of Systems:  No ROS.  Medicare Wellness Visit. Additional risk factors are reflected in the social history.  Cardiac Risk Factors include: advanced age (>85men, >46 women)   Sleep patterns: 6 hrs/night. Wakes up feeling rested. Home Safety/Smoke Alarms: Feels safe in home. Smoke alarms in place.  Living environment; residence and Firearm Safety: Lives with husband in two story home. No difficulty with stairs.  Seat Belt Safety/Bike Helmet: Wears seat belt.   Counseling:   Dental- Every 6 months.   Female:   Pap-   Aged out.    Mammo-    09/23/2015. Pt wishes to do these every 2 years.   Dexa scan-  04/28/2016  Mild bone density loss.    CCS- Follows Dr Collene Mares next week.      Objective:     Vitals: BP 100/60   Pulse 64   Temp 98.4 F (36.9 C) (Oral)   Ht 5' 0.25" (1.53 m)   Wt 96 lb 3.2 oz (43.6 kg)   BMI 18.63 kg/m   Body mass index is 18.63 kg/m.   Tobacco History  Smoking Status  . Never Smoker  Smokeless Tobacco  . Never Used     Counseling given: Not Answered   Past Medical History:  Diagnosis Date  . Colitis, collagenous    [facility] by [facility] at [facility] in GI  . Colon polyps   . Osteopenia    on > 5 years of bisphosphonate in the past  . Rosacea    sees dermatologist Dr. Ubaldo Glassing for this  . Thyroid disease    Past Surgical History:  Procedure Laterality Date  . ABDOMINAL HYSTERECTOMY  1984  . ABDOMINAL HYSTERECTOMY     for menorrhagia, no cervix per pt  . THYROID SURGERY  2009   Dr. Margot Chimes    Family History  Problem Relation Age of Onset  . Colon cancer Unknown    History  Sexual Activity  . Sexual activity: Not on file    Outpatient Encounter Prescriptions as of 11/29/2016  Medication Sig  . aspirin 81 MG tablet Take 81 mg by mouth every Monday, Wednesday, and Friday.   . Azelaic Acid (FINACEA) 15 %  cream Apply topically as needed. After skin is thoroughly washed and patted dry, gently but thoroughly massage a thin film of azelaic acid cream into the affected area twice daily, in the morning and evening.  Marland Kitchen BIOTIN 5000 PO Take by mouth daily.  . Cholecalciferol (VITAMIN D3 PO) Take 1,000 Units by mouth daily.  Marland Kitchen levothyroxine (SYNTHROID, LEVOTHROID) 75 MCG tablet Take 1 tablet daily before breakfast  . Probiotic Product (PROBIOTIC DAILY PO) Take by mouth daily.  Marland Kitchen tetracycline (ACHROMYCIN,SUMYCIN) 500 MG capsule Take 500 mg by mouth. Every 4 days  . [DISCONTINUED] budesonide (ENTOCORT EC) 3 MG 24 hr capsule Take 2 capsules (6 mg total) by mouth 3 (three) times daily.  . [DISCONTINUED] calcium-vitamin D (OSCAL WITH D) 500-200 MG-UNIT per tablet Take 1 tablet by mouth daily.   No facility-administered encounter medications on file as of 11/29/2016.     Activities of Daily Living In your present state of health, do you have any difficulty performing the following activities: 11/29/2016  Hearing? N  Vision? N  Difficulty concentrating or making decisions? N  Walking or climbing stairs? N  Dressing or bathing? N  Doing errands, shopping? N  Preparing Food and eating ? N  Using the Toilet? N  In the past six months, have you accidently leaked urine? N  Do you have problems with loss of bowel control? N  Managing your Medications? N  Managing your Finances? N  Housekeeping or managing your Housekeeping? N  Some recent data might be hidden    Patient Care Team: Lucretia Kern, DO as PCP - General (Family Medicine) Juanita Craver, MD as Attending Physician (Gastroenterology) Rolm Bookbinder, MD as Attending Physician (Dermatology)    Assessment:    Physical assessment deferred to PCP.  Exercise Activities and Dietary recommendations Current Exercise Habits: Home exercise routine, Type of exercise: walking (walks 2 miles and does yard work. ), Time (Minutes): 45, Frequency (Times/Week): 7,  Weekly Exercise (Minutes/Week): 315, Intensity: Mild   Diet (meal preparation, eat out, water intake, caffeinated beverages, dairy products, fruits and vegetables): Pt states that she eats healthy. Eats 3 meals/day. Hot chocolate with breakfast. No coffee or tea. 2 bottles of water/day.  Breakfast: Egg white and 1/2 english muffin and cooked apples.  Snack: Soda crackers. Lunch: Chicken sandwich, makes at home. Angie Fava.  Dinner: Meat and two vegetables (1 yellow and 1 green). Juice glass size of red wine/night.      Goals    . Maintain current health       Fall Risk Fall Risk  11/29/2016 11/29/2016 11/26/2015 08/28/2014 07/12/2013  Falls in the past year? No No No Yes No  Number falls in past yr: - - - 1 -  Injury with Fall? - - - Yes -   Depression Screen PHQ 2/9 Scores 11/29/2016 11/29/2016 11/26/2015 08/28/2014  PHQ - 2 Score 0 0 0 0     Cognitive Function   Ad8 score reviewed for issues:  Issues making decisions:no  Less interest in hobbies / activities:no  Repeats questions, stories (family complaining):no  Trouble using ordinary gadgets (microwave, computer, phone):no  Forgets the month or year: no  Mismanaging finances: no  Remembering appts:no  Daily problems with thinking and/or memory:no Ad8 score is=0       Immunization History  Administered Date(s) Administered  . Influenza, High Dose Seasonal PF 03/07/2016  . Influenza-Unspecified 02/27/2013  . Tdap 07/11/2012   Screening Tests Health Maintenance  Topic Date Due  . MAMMOGRAM  03/01/2017 (Originally 09/22/2016)  . COLONOSCOPY  03/01/2017 (Originally 11/13/2016)  . PNA vac Low Risk Adult (1 of 2 - PCV13) 08/27/2024 (Originally 02/05/2008)  . INFLUENZA VACCINE  12/28/2016  . TETANUS/TDAP  07/11/2022  . DEXA SCAN  Completed      Plan:   Follow up with PCP as directed.  Bring a copy of your advance directives to your next office visit.  Mammograms every 2 years.   Dr Collene Mares (GI) next week.  I have  personally reviewed and noted the following in the patient's chart:   . Medical and social history . Use of alcohol, tobacco or illicit drugs  . Current medications and supplements . Functional ability and status . Nutritional status . Physical activity . Advanced directives . List of other physicians . Hospitalizations, surgeries, and ER visits in previous 12 months . Vitals . Screenings to include cognitive, depression, and falls . Referrals and appointments  In addition, I have reviewed and discussed with patient certain preventive protocols, quality metrics, and best practice recommendations. A written personalized care plan for preventive services as well as general preventive health recommendations were provided to patient.     Ree Edman,  RN  11/29/2016

## 2016-11-29 NOTE — Progress Notes (Signed)
Agree with above. Colin Benton R., DO

## 2016-11-29 NOTE — Patient Instructions (Addendum)
BEFORE YOU LEAVE: -follow up: yearly with Dr. Maudie Mercury for follow up and with health coach for wellness visit on the same day -labs  Please bring a copy of your advanced directives to your next visit.  Get your mammogram every 1-2 years and do self breast exams.  See Dr. Collene Mares for your colonoscopy.-+  We have ordered labs or studies at this visit. It can take up to 1-2 weeks for results and processing. IF results require follow up or explanation, we will call you with instructions. Clinically stable results will be released to your [facility]. If you have not heard from [place] or cannot find your results in [facility] in 2 weeks please contact our office at [phone].  If you are not yet signed up for [facility], please consider signing up.  WE NOW OFFER   [facility] FAST TRACK!!!  SAME DAY Appointments for ACUTE CARE  Such as: Sprains, Injuries, cuts, abrasions, rashes, muscle pain, joint pain, back pain Colds, flu, sore throats, headache, allergies, cough, fever  Ear pain, sinus and eye infections Abdominal pain, nausea, vomiting, diarrhea, upset stomach Animal/insect bites  3 Easy Ways to Schedule: Walk-In Scheduling Call in scheduling Mychart Sign-up: [email or web address]

## 2016-12-06 DIAGNOSIS — Z1211 Encounter for screening for malignant neoplasm of colon: Secondary | ICD-10-CM | POA: Diagnosis not present

## 2016-12-06 DIAGNOSIS — R141 Gas pain: Secondary | ICD-10-CM | POA: Diagnosis not present

## 2016-12-06 DIAGNOSIS — K52831 Collagenous colitis: Secondary | ICD-10-CM | POA: Diagnosis not present

## 2016-12-06 DIAGNOSIS — Z800 Family history of malignant neoplasm of digestive organs: Secondary | ICD-10-CM | POA: Diagnosis not present

## 2016-12-06 DIAGNOSIS — K5730 Diverticulosis of large intestine without perforation or abscess without bleeding: Secondary | ICD-10-CM | POA: Diagnosis not present

## 2016-12-16 ENCOUNTER — Telehealth: Payer: Self-pay | Admitting: *Deleted

## 2016-12-16 NOTE — Telephone Encounter (Signed)
I called the pt and she stated she and Dr Maudie Mercury discussed this and it was recommended that she have a mammogram every 2 years and the last one was done on 09/23/15.

## 2016-12-16 NOTE — Telephone Encounter (Signed)
-----   Message from Lucretia Kern, DO sent at 12/14/2016 10:43 AM EDT ----- Please assist her in getting her mammogram if not done. Thanks. ----- Message ----- From: SYSTEM Sent: 11/25/2016  12:05 AM To: Lucretia Kern, DO

## 2016-12-26 ENCOUNTER — Ambulatory Visit: Payer: Medicare HMO | Admitting: Family Medicine

## 2016-12-26 DIAGNOSIS — Z1211 Encounter for screening for malignant neoplasm of colon: Secondary | ICD-10-CM | POA: Diagnosis not present

## 2016-12-26 DIAGNOSIS — K52831 Collagenous colitis: Secondary | ICD-10-CM | POA: Diagnosis not present

## 2016-12-26 DIAGNOSIS — R194 Change in bowel habit: Secondary | ICD-10-CM | POA: Diagnosis not present

## 2016-12-26 DIAGNOSIS — K5730 Diverticulosis of large intestine without perforation or abscess without bleeding: Secondary | ICD-10-CM | POA: Diagnosis not present

## 2016-12-28 DIAGNOSIS — Z1211 Encounter for screening for malignant neoplasm of colon: Secondary | ICD-10-CM | POA: Diagnosis not present

## 2016-12-28 DIAGNOSIS — K5730 Diverticulosis of large intestine without perforation or abscess without bleeding: Secondary | ICD-10-CM | POA: Diagnosis not present

## 2016-12-28 DIAGNOSIS — Z800 Family history of malignant neoplasm of digestive organs: Secondary | ICD-10-CM | POA: Diagnosis not present

## 2016-12-28 LAB — HM COLONOSCOPY

## 2016-12-29 ENCOUNTER — Encounter: Payer: Self-pay | Admitting: Family Medicine

## 2017-01-31 ENCOUNTER — Ambulatory Visit: Payer: Medicare HMO | Admitting: Family Medicine

## 2017-02-16 ENCOUNTER — Encounter: Payer: Self-pay | Admitting: Family Medicine

## 2017-03-06 ENCOUNTER — Ambulatory Visit (INDEPENDENT_AMBULATORY_CARE_PROVIDER_SITE_OTHER): Payer: Medicare HMO | Admitting: *Deleted

## 2017-03-06 DIAGNOSIS — Z23 Encounter for immunization: Secondary | ICD-10-CM | POA: Diagnosis not present

## 2017-03-09 ENCOUNTER — Encounter: Payer: Self-pay | Admitting: Family Medicine

## 2017-03-09 MED ORDER — LEVOTHYROXINE SODIUM 75 MCG PO TABS: ORAL_TABLET | ORAL | 1 refills | Status: DC

## 2017-04-14 DIAGNOSIS — H524 Presbyopia: Secondary | ICD-10-CM | POA: Diagnosis not present

## 2017-05-05 DIAGNOSIS — D1801 Hemangioma of skin and subcutaneous tissue: Secondary | ICD-10-CM | POA: Diagnosis not present

## 2017-05-05 DIAGNOSIS — L661 Lichen planopilaris: Secondary | ICD-10-CM | POA: Diagnosis not present

## 2017-05-05 DIAGNOSIS — L718 Other rosacea: Secondary | ICD-10-CM | POA: Diagnosis not present

## 2017-05-05 DIAGNOSIS — L821 Other seborrheic keratosis: Secondary | ICD-10-CM | POA: Diagnosis not present

## 2017-05-05 DIAGNOSIS — L814 Other melanin hyperpigmentation: Secondary | ICD-10-CM | POA: Diagnosis not present

## 2017-06-08 ENCOUNTER — Encounter: Payer: Self-pay | Admitting: Family Medicine

## 2017-11-01 DIAGNOSIS — Z1231 Encounter for screening mammogram for malignant neoplasm of breast: Secondary | ICD-10-CM | POA: Diagnosis not present

## 2017-11-01 LAB — HM MAMMOGRAPHY

## 2017-12-05 NOTE — Progress Notes (Signed)
Medicare Annual Preventive Care Visit  (initial annual wellness or annual wellness exam)  Concerns and/or follow up today:  Diane Becker is a pleasant 77 year old her for her annual exam and follow up. She has a past medical history significant for hypothyroidism and collagenous colitis.Sees Dr. Collene Mares for her colitis. Rarely comes in - last appt over 1 year ago.  Reports she is doing well for the most part.  She had a little bit of yellow vaginal discharge a few months ago after douching.  No bleeding or brown discharge.  No other concerns currently.  She continues to garden and is trying to downsize.  Gets regular exercise.  Eats healthy.  She had a mammogram at [place] last month.  Continues to refuse vaccines.  See HM section in Epic for other details of completed HM. See scanned documentation under Media Tab for further documentation HPI, health risk assessment. See Media Tab and Care Teams sections in Epic for other providers.  ROS: negative for report of fevers, unintentional weight loss, vision changes, vision loss, hearing loss or change, chest pain, sob, hemoptysis, melena, hematochezia, hematuria, genital  lesions, falls, bleeding.  Takes an aspirin daily and does have some bruising with this., loc, thoughts of suicide or self harm, memory loss she is not sure why she takes aspirin, but has taken it for a long time.  Denies a history of blood clots, strokes or heart disease.  1.) Patient-completed health risk assessment  - completed and reviewed, see scanned documentation  2.) Review of Medical History: -PMH, PSH, Family History and current specialty and care providers reviewed and updated and listed below  - see scanned in document in chart and below  Past Medical History:  Diagnosis Date  . Colitis, collagenous    Followed by Dr. Collene Mares at [facility] in GI  . Colon polyps   . Osteopenia    on > 5 years of bisphosphonate in the past  . Rosacea    sees dermatologist Dr. Ubaldo Glassing  for this  . Thyroid disease     Past Surgical History:  Procedure Laterality Date  . ABDOMINAL HYSTERECTOMY  1984  . ABDOMINAL HYSTERECTOMY     for menorrhagia, no cervix per pt  . THYROID SURGERY  2009   Dr. Margot Chimes     Social History   Socioeconomic History  . Marital status: Married    Spouse name: Not on file  . Number of children: Not on file  . Years of education: Not on file  . Highest education level: Not on file  Occupational History  . Not on file  Social Needs  . Financial resource strain: Not on file  . Food insecurity:    Worry: Not on file    Inability: Not on file  . Transportation needs:    Medical: Not on file    Non-medical: Not on file  Tobacco Use  . Smoking status: Never Smoker  . Smokeless tobacco: Never Used  Substance and Sexual Activity  . Alcohol use: No  . Drug use: No  . Sexual activity: Never  Lifestyle  . Physical activity:    Days per week: Not on file    Minutes per session: Not on file  . Stress: Not on file  Relationships  . Social connections:    Talks on phone: Not on file    Gets together: Not on file    Attends religious service: Not on file    Active member of club or organization:  Not on file    Attends meetings of clubs or organizations: Not on file    Relationship status: Not on file  . Intimate partner violence:    Fear of current or ex partner: Not on file    Emotionally abused: Not on file    Physically abused: Not on file    Forced sexual activity: Not on file  Other Topics Concern  . Not on file  Social History Narrative  . Not on file    Family History  Problem Relation Age of Onset  . Colon cancer Unknown     Current Outpatient Medications on File Prior to Visit  Medication Sig Dispense Refill  . Azelaic Acid (FINACEA) 15 % cream Apply topically as needed. After skin is thoroughly washed and patted dry, gently but thoroughly massage a thin film of azelaic acid cream into the affected area twice daily,  in the morning and evening.    Marland Kitchen BIOTIN 5000 PO Take by mouth daily.    . Cholecalciferol (VITAMIN D3 PO) Take 1,000 Units by mouth daily.    Marland Kitchen levothyroxine (SYNTHROID, LEVOTHROID) 75 MCG tablet Take 1 tablet daily before breakfast 90 tablet 1  . Probiotic Product (PROBIOTIC DAILY PO) Take by mouth daily.    Marland Kitchen tetracycline (ACHROMYCIN,SUMYCIN) 500 MG capsule Take 500 mg by mouth. Every 4 days     No current facility-administered medications on file prior to visit.      3.) Review of functional ability and level of safety:  Any difficulty hearing?  See scanned documentation  History of falling?  See scanned documentation  Any trouble with IADLs - using a phone, using transportation, grocery shopping, preparing meals, doing housework, doing laundry, taking medications and managing money?  See scanned documentation  Advance Directives?  Discussed briefly and offered more resources and detailed discussion with our trained staff.  She has advanced directives.  She does not wish to change them.  Recommended that she bring a copy for [place] to scan into her record.  See summary of recommendations in Patient Instructions below.  4.) Physical Exam Vitals:   12/07/17 0718  BP: 122/60  Pulse: (!) 58  Temp: 98.2 F (36.8 C)   Estimated body mass index is 18.71 kg/m as calculated from the following:   Height as of this encounter: 5' 0.5" (1.537 m).   Weight as of this encounter: 97 lb 6.4 oz (44.2 kg).  EKG (optional): deferred  GENERAL: vitals reviewed and listed below, alert, oriented, appears well hydrated and in no acute distress, see vision screen in EPIC  HEENT: head atraumatic, PERRLA, normal appearance of eyes, ears, nose and mouth. moist mucus membranes.  NECK: supple, no masses or lymphadenopathy  LUNGS: clear to auscultation bilaterally, no rales, rhonchi or wheeze  CV: HRRR, no peripheral edema or cyanosis, normal pedal pulses  ABDOMEN: bowel sounds normal, soft, non  tender to palpation, no masses, no rebound or guarding  GU/BREAST: deferred/declined  SKIN: no rash or abnormal lesions except a few small bruises on arms, sees dermatology for full skin exam  MS: normal gait, moves all extremities normally  NEURO: normal gait, speech and thought processing grossly intact, muscle tone grossly intact throughout  PSYCH: normal affect, pleasant and cooperative  Cognitive function grossly intact  See patient instructions for recommendations.  Education and counseling regarding the above review of health provided with a plan for the following: -see scanned patient completed form for further details -fall prevention strategies discussed  -healthy lifestyle discussed -  importance and resources for completing advanced directives discussed -see patient instructions below for any other recommendations provided  4)The following written screening schedule of preventive measures were reviewed with assessment and plan made per below, orders and patient instructions:       Alcohol screening done     Obesity Screening and counseling done     STI screening (Hep C if born 1943-65) offered and per pt wishes     Tobacco Screening done done       Pneumococcal (PPSV23 -one dose after 64, one before if risk factors), influenza yearly and hepatitis B vaccines (if high risk - end stage renal disease, IV drugs, homosexual men, live in home for mentally retarded, hemophilia receiving factors) ASSESSMENT/PLAN: done if applicable - refused      Screening mammograph (yearly if >40) ASSESSMENT/PLAN: advised assistant to obtain report and update      Screening Pap smear/pelvic exam (q2 years) ASSESSMENT/PLAN: n/a, declined pelvic today      Colorectal cancer screening (FOBT yearly or flex sig q4y or colonoscopy q10y or barium enema q4y) ASSESSMENT/PLAN: sees Dr. Collene Mares      Diabetes outpatient self-management training services ASSESSMENT/PLAN: utd or done      Bone mass  measurements(covered q2y if indicated - estrogen def, osteoporosis, hyperparathyroid, vertebral abnormalities, osteoporosis or steroids) ASSESSMENT/PLAN: utd, done 03/2016      Screening for glaucoma(q1y if high risk - diabetes, FH, AA and > 39 or hispanic and > 65) ASSESSMENT/PLAN: utd or advised      Medical nutritional therapy for individuals with diabetes or renal disease ASSESSMENT/PLAN: see orders      Cardiovascular screening blood tests (lipids q5y) ASSESSMENT/PLAN: see orders and labs      Diabetes screening tests ASSESSMENT/PLAN: see orders and labs   7.) Summary: -risk factors and conditions per above assessment were discussed and treatment, recommendations and referrals were offered per documentation above and orders and patient instructions.  Medicare annual wellness visit, subsequent - Plan: Hemoglobin A1c, Lipid panel  Screening for depression  Hypothyroidism, unspecified type - Plan: TSH  Colitis, collagenous - followed by Dr. Collene Mares at [facility]  Patient Instructions   BEFORE YOU LEAVE: -obtain and abstract mammogram results from [facility] -labs -follow up: 6 months  We have ordered labs or studies at this visit. It can take up to 1-2 weeks for results and processing. IF results require follow up or explanation, we will call you with instructions. Clinically stable results will be released to your [facility]. If you have not heard from [place] or cannot find your results in [facility] in 2 weeks please contact our office at [phone].  If you are not yet signed up for [facility], please consider signing up.   Ms. Abair , Thank you for taking time to come for your Medicare Wellness Visit. I appreciate your ongoing commitment to your health goals. Please review the following plan we discussed and let me know if I can assist you in the future.    Goals     Please bring copy of Advanced Directives for [place] to scan into your chart  We recommend the following healthy  lifestyle for LIFE: 1) Small portions. But, make sure to get regular (at least 3 per day), healthy meals and small healthy snacks if needed.  2) Eat a healthy clean diet.   TRY TO EAT: -at least 5-7 servings of low sugar, colorful, and nutrient rich vegetables per day (not corn, potatoes or bananas.) -berries are the best choice  if you wish to eat fruit (only eat small amounts if trying to reduce weight)  -lean meets (fish, white meat of chicken or [place]) -vegan proteins for some meals - beans or tofu, whole grains, nuts and seeds -Replace bad fats with good fats - good fats include: fish, nuts and seeds, canola oil, olive oil -small amounts of low fat or non fat dairy -small amounts of100 % whole grains - check the lables -drink plenty of water  AVOID: -SUGAR, sweets, anything with added sugar, corn syrup or sweeteners - must read labels as even foods advertised as "healthy" often are loaded with sugar -if you must have a sweetener, small amounts of stevia may be best -sweetened beverages and artificially sweetened beverages -simple starches (rice, bread, potatoes, pasta, chips, etc - small amounts of 100% whole grains are ok) -red meat, pork, butter -fried foods, fast food, processed food, excessive dairy, eggs and coconut.  3)Get at least 150 minutes of sweaty aerobic exercise per week.  4)Reduce stress - consider counseling, meditation and relaxation to balance other aspects of your life.       This is a list of the screening recommended for you and due dates:  Health Maintenance  Topic Date Due  . Mammogram  Done - [facility], obtain report to update record  . Pneumonia vaccines (1 of 2 - PCV13) refused  . Flu Shot  12/28/2017  . Colon Cancer Screening  12/28/2021  . Tetanus Vaccine  07/11/2022  . DEXA scan (bone density measurement)  Completed  *Topic was postponed. The date shown is not the original due date.        Lucretia Kern,  DO

## 2017-12-07 ENCOUNTER — Ambulatory Visit (INDEPENDENT_AMBULATORY_CARE_PROVIDER_SITE_OTHER): Payer: Medicare HMO | Admitting: Family Medicine

## 2017-12-07 ENCOUNTER — Encounter: Payer: Self-pay | Admitting: Family Medicine

## 2017-12-07 VITALS — BP 122/60 | HR 58 | Temp 98.2°F | Ht 60.5 in | Wt 97.4 lb

## 2017-12-07 DIAGNOSIS — Z0000 Encounter for general adult medical examination without abnormal findings: Secondary | ICD-10-CM

## 2017-12-07 DIAGNOSIS — Z1331 Encounter for screening for depression: Secondary | ICD-10-CM

## 2017-12-07 DIAGNOSIS — K52831 Collagenous colitis: Secondary | ICD-10-CM

## 2017-12-07 DIAGNOSIS — E039 Hypothyroidism, unspecified: Secondary | ICD-10-CM

## 2017-12-07 LAB — LIPID PANEL
CHOL/HDL RATIO: 3
Cholesterol: 296 mg/dL — ABNORMAL HIGH (ref 0–200)
HDL: 85.9 mg/dL (ref >39.00)
LDL Cholesterol: 194 mg/dL — ABNORMAL HIGH (ref 0–99)
NonHDL: 210.03
TRIGLYCERIDES: 80 mg/dL (ref 0.0–149.0)
VLDL: 16 mg/dL (ref 0.0–40.0)

## 2017-12-07 LAB — TSH: TSH: 3.32 u[IU]/mL (ref 0.35–4.50)

## 2017-12-07 LAB — HEMOGLOBIN A1C: Hgb A1c MFr Bld: 5.8 % (ref 4.6–6.5)

## 2017-12-07 MED ORDER — LEVOTHYROXINE SODIUM 75 MCG PO TABS: ORAL_TABLET | ORAL | 3 refills | Status: DC

## 2017-12-07 NOTE — Addendum Note (Signed)
Addended by: Agnes Lawrence on: 12/07/2017 03:19 PM   Modules accepted: Orders

## 2017-12-07 NOTE — Patient Instructions (Addendum)
BEFORE YOU LEAVE: -obtain and abstract mammogram results from [facility] -labs -follow up: 6 months  We have ordered labs or studies at this visit. It can take up to 1-2 weeks for results and processing. IF results require follow up or explanation, we will call you with instructions. Clinically stable results will be released to your [facility]. If you have not heard from [place] or cannot find your results in [facility] in 2 weeks please contact our office at [phone].  If you are not yet signed up for [facility], please consider signing up.   Ms. Diane Becker , Thank you for taking time to come for your Medicare Wellness Visit. I appreciate your ongoing commitment to your health goals. Please review the following plan we discussed and let me know if I can assist you in the future.    Goals     Please bring copy of Advanced Directives for [place] to scan into your chart  We recommend the following healthy lifestyle for LIFE: 1) Small portions. But, make sure to get regular (at least 3 per day), healthy meals and small healthy snacks if needed.  2) Eat a healthy clean diet.   TRY TO EAT: -at least 5-7 servings of low sugar, colorful, and nutrient rich vegetables per day (not corn, potatoes or bananas.) -berries are the best choice if you wish to eat fruit (only eat small amounts if trying to reduce weight)  -lean meets (fish, white meat of chicken or [place]) -vegan proteins for some meals - beans or tofu, whole grains, nuts and seeds -Replace bad fats with good fats - good fats include: fish, nuts and seeds, canola oil, olive oil -small amounts of low fat or non fat dairy -small amounts of100 % whole grains - check the lables -drink plenty of water  AVOID: -SUGAR, sweets, anything with added sugar, corn syrup or sweeteners - must read labels as even foods advertised as "healthy" often are loaded with sugar -if you must have a sweetener, small amounts of stevia may be best -sweetened beverages and  artificially sweetened beverages -simple starches (rice, bread, potatoes, pasta, chips, etc - small amounts of 100% whole grains are ok) -red meat, pork, butter -fried foods, fast food, processed food, excessive dairy, eggs and coconut.  3)Get at least 150 minutes of sweaty aerobic exercise per week.  4)Reduce stress - consider counseling, meditation and relaxation to balance other aspects of your life.       This is a list of the screening recommended for you and due dates:  Health Maintenance  Topic Date Due  . Mammogram  Done - [facility], obtain report to update record  . Pneumonia vaccines (1 of 2 - PCV13) refused  . Flu Shot  12/28/2017  . Colon Cancer Screening  12/28/2021  . Tetanus Vaccine  07/11/2022  . DEXA scan (bone density measurement)  Completed  *Topic was postponed. The date shown is not the original due date.

## 2017-12-11 NOTE — Progress Notes (Signed)
  HPI:  Using dictation device. Unfortunately this device frequently misinterprets words/phrases.  Acute visit for lab review for hyperlipidemia and prediabetes: -LDL 194, though ratio ok - higher then in the past -hgba1c 5.8 -reports feels is due to dietary indiscretion - not eating as healthy -does get regular exercise - walks daily 30 minutes -no CP, SOB, swelling, HA -monitors BP at home and is in 120 SBP range - feels elevated related to nerves today -wants to avoid medication if possible  ROS: See pertinent positives and negatives per HPI.  Past Medical History:  Diagnosis Date  . Colitis, collagenous    Followed by Dr. Collene Mares at [facility] in GI  . Colon polyps   . Osteopenia    on > 5 years of bisphosphonate in the past  . Rosacea    sees dermatologist Dr. Ubaldo Glassing for this  . Thyroid disease     Past Surgical History:  Procedure Laterality Date  . ABDOMINAL HYSTERECTOMY  1984  . ABDOMINAL HYSTERECTOMY     for menorrhagia, no cervix per pt  . THYROID SURGERY  2009   Dr. Margot Chimes     Family History  Problem Relation Age of Onset  . Colon cancer Unknown     SOCIAL HX: see hpi   Current Outpatient Medications:  .  Azelaic Acid (FINACEA) 15 % cream, Apply topically as needed. After skin is thoroughly washed and patted dry, gently but thoroughly massage a thin film of azelaic acid cream into the affected area twice daily, in the morning and evening., Disp: , Rfl:  .  BIOTIN 5000 PO, Take by mouth daily., Disp: , Rfl:  .  Cholecalciferol (VITAMIN D3 PO), Take 1,000 Units by mouth daily., Disp: , Rfl:  .  levothyroxine (SYNTHROID, LEVOTHROID) 75 MCG tablet, Take 1 tablet daily before breakfast, Disp: 90 tablet, Rfl: 3 .  Probiotic Product (PROBIOTIC DAILY PO), Take by mouth daily., Disp: , Rfl:  .  tetracycline (ACHROMYCIN,SUMYCIN) 500 MG capsule, Take 500 mg by mouth. Every 4 days, Disp: , Rfl:   EXAM:  Vitals:   12/12/17 0958  BP: 138/68  Pulse: (!) 59   Temp: 98.4 F (36.9 C)    Body mass index is 18.71 kg/m.  GENERAL: vitals reviewed and listed above, alert, oriented, appears well hydrated and in no acute distress  HEENT: atraumatic, conjunttiva clear, no obvious abnormalities on inspection of external nose and ears  NECK: no obvious masses on inspection  LUNGS: clear to auscultation bilaterally, no wheezes, rales or rhonchi, good air movement  CV: HRRR, no peripheral edema  MS: moves all extremities without noticeable abnormality  PSYCH: pleasant and cooperative, no obvious depression or anxiety  ASSESSMENT AND PLAN:  Discussed the following assessment and plan:  Hyperlipidemia, unspecified hyperlipidemia type  Hyperglycemia  Elevated blood pressure reading  -reviewed labs and treatment options, implications, risks -she adamantly does not want to start medications at this time -agrees to work on heart healthy Mediterranean type diet -agrees to follow up and recheck in 3-4 months -BP better on recheck, but still mildly elevated, agrees to cont home BP monitoring -Patient advised to return or notify a doctor immediately if symptoms worsen or persist or new concerns arise.  There are no Patient Instructions on file for this visit.  Lucretia Kern, DO

## 2017-12-12 ENCOUNTER — Ambulatory Visit (INDEPENDENT_AMBULATORY_CARE_PROVIDER_SITE_OTHER): Payer: Medicare HMO | Admitting: Family Medicine

## 2017-12-12 ENCOUNTER — Encounter: Payer: Self-pay | Admitting: Family Medicine

## 2017-12-12 VITALS — BP 138/68 | HR 59 | Temp 98.4°F | Ht 60.5 in

## 2017-12-12 DIAGNOSIS — R030 Elevated blood-pressure reading, without diagnosis of hypertension: Secondary | ICD-10-CM

## 2017-12-12 DIAGNOSIS — R739 Hyperglycemia, unspecified: Secondary | ICD-10-CM | POA: Diagnosis not present

## 2017-12-12 DIAGNOSIS — E785 Hyperlipidemia, unspecified: Secondary | ICD-10-CM

## 2018-03-14 ENCOUNTER — Ambulatory Visit (INDEPENDENT_AMBULATORY_CARE_PROVIDER_SITE_OTHER): Payer: Medicare HMO | Admitting: *Deleted

## 2018-03-14 DIAGNOSIS — Z23 Encounter for immunization: Secondary | ICD-10-CM

## 2018-04-15 NOTE — Progress Notes (Signed)
HPI:  Using dictation device. Unfortunately this device frequently misinterprets words/phrases.  Diane Becker is a pleasant 77 y.o. here for follow up. Chronic medical problems summarized below were reviewed for changes and stability and were updated as needed below. These issues and their treatment remain stable for the most part. Trying to eat healthy and get regular exercise. Agreeable to statin if cholesterol worsening or not improving. Denies CP, SOB, DOE, treatment intolerance or new symptoms.  Hypothyroidism: -levothyroxine  Hyperlipidemia/Hyperglycmia: -declined medication -preferred to work on diet and exercise instead  Collitis, collagenous: -managed by Dr. Collene Mares, GI  Osteopenia: -dexa in 2017  ROS: See pertinent positives and negatives per HPI.  Past Medical History:  Diagnosis Date  . Colitis, collagenous    Followed by Dr. Collene Mares at [facility] in GI  . Colon polyps   . Osteopenia    on > 5 years of bisphosphonate in the past  . Rosacea    sees dermatologist Dr. Ubaldo Glassing for this  . Thyroid disease     Past Surgical History:  Procedure Laterality Date  . ABDOMINAL HYSTERECTOMY  1984  . ABDOMINAL HYSTERECTOMY     for menorrhagia, no cervix per pt  . THYROID SURGERY  2009   Dr. Margot Chimes     Family History  Problem Relation Age of Onset  . Colon cancer Unknown     SOCIAL HX:see hpi   Current Outpatient Medications:  .  Azelaic Acid (FINACEA) 15 % cream, Apply topically as needed. After skin is thoroughly washed and patted dry, gently but thoroughly massage a thin film of azelaic acid cream into the affected area twice daily, in the morning and evening., Disp: , Rfl:  .  BIOTIN 5000 PO, Take by mouth daily., Disp: , Rfl:  .  Cholecalciferol (VITAMIN D3 PO), Take 1,000 Units by mouth daily., Disp: , Rfl:  .  levothyroxine (SYNTHROID, LEVOTHROID) 75 MCG tablet, Take 1 tablet daily before breakfast, Disp: 90 tablet, Rfl: 3 .  Probiotic Product (PROBIOTIC  DAILY PO), Take by mouth daily., Disp: , Rfl:  .  tetracycline (ACHROMYCIN,SUMYCIN) 500 MG capsule, Take 500 mg by mouth. Every 4 days, Disp: , Rfl:   EXAM:  Vitals:   04/16/18 0824  BP: 120/80  Pulse: 62  Temp: (!) 97.3 F (36.3 C)  SpO2: 97%    Body mass index is 18.38 kg/m.  GENERAL: vitals reviewed and listed above, alert, oriented, appears well hydrated and in no acute distress  HEENT: atraumatic, conjunttiva clear, no obvious abnormalities on inspection of external nose and ears  NECK: no obvious masses on inspection  LUNGS: clear to auscultation bilaterally, no wheezes, rales or rhonchi, good air movement  CV: HRRR, no peripheral edema  MS: moves all extremities without noticeable abnormality  PSYCH: pleasant and cooperative, no obvious depression or anxiety  ASSESSMENT AND PLAN:  Discussed the following assessment and plan:  Hyperlipidemia, unspecified hyperlipidemia type - Plan: Lipid panel  Hypothyroidism, unspecified type - Plan: TSH  Hyperglycemia  -labs per orders -congratulated on healthy lifestyle, though she is actually underweight, so recommend regular and robust meals of healthy foods -discussed risks/benefits statin and will assess CV risks once labs back and then make recommendation -follow up 4-6 months, sooner as needed -Patient advised to return or notify a doctor immediately if symptoms worsen or persist or new concerns arise.  Patient Instructions  BEFORE YOU LEAVE: -labs -? dexa -follow up: 4-6 months   We have ordered labs or studies at this visit.  It can take up to 1-2 weeks for results and processing. IF results require follow up or explanation, we will call you with instructions. Clinically stable results will be released to your [facility]. If you have not heard from [place] or cannot find your results in [facility] in 2 weeks please contact our office at [phone].  If you are not yet signed up for [facility], please consider signing  up.    We recommend the following healthy lifestyle for LIFE: 1) Small portions. But, make sure to get regular (at least 3 per day), healthy meals and small healthy snacks if needed.  2) Eat a healthy clean diet.   TRY TO EAT: -at least 5-7 servings of low sugar, colorful, and nutrient rich vegetables per day (not corn, potatoes or bananas.) -berries are the best choice if you wish to eat fruit (only eat small amounts if trying to reduce weight)  -lean meets (fish, white meat of chicken or [place]) -vegan proteins for some meals - beans or tofu, whole grains, nuts and seeds -Replace bad fats with good fats - good fats include: fish, nuts and seeds, canola oil, olive oil -small amounts of low fat or non fat dairy -small amounts of100 % whole grains - check the lables -drink plenty of water  AVOID: -SUGAR, sweets, anything with added sugar, corn syrup or sweeteners - must read labels as even foods advertised as "healthy" often are loaded with sugar -if you must have a sweetener, small amounts of stevia may be best -sweetened beverages and artificially sweetened beverages -simple starches (rice, bread, potatoes, pasta, chips, etc - small amounts of 100% whole grains are ok) -red meat, pork, butter -fried foods, fast food, processed food, excessive dairy, eggs and coconut.  3)Get at least 150 minutes of sweaty aerobic exercise per week.  4)Reduce stress - consider counseling, meditation and relaxation to balance other aspects of your life.        Lucretia Kern, DO

## 2018-04-16 ENCOUNTER — Encounter: Payer: Self-pay | Admitting: Family Medicine

## 2018-04-16 ENCOUNTER — Ambulatory Visit (INDEPENDENT_AMBULATORY_CARE_PROVIDER_SITE_OTHER): Payer: Medicare HMO | Admitting: Family Medicine

## 2018-04-16 VITALS — BP 120/80 | HR 62 | Temp 97.3°F | Ht 60.5 in | Wt 95.7 lb

## 2018-04-16 DIAGNOSIS — E039 Hypothyroidism, unspecified: Secondary | ICD-10-CM

## 2018-04-16 DIAGNOSIS — R739 Hyperglycemia, unspecified: Secondary | ICD-10-CM | POA: Diagnosis not present

## 2018-04-16 DIAGNOSIS — E785 Hyperlipidemia, unspecified: Secondary | ICD-10-CM

## 2018-04-16 LAB — TSH: TSH: 3.65 u[IU]/mL (ref 0.35–4.50)

## 2018-04-16 LAB — LIPID PANEL
CHOL/HDL RATIO: 4
Cholesterol: 278 mg/dL — ABNORMAL HIGH (ref 0–200)
HDL: 75.1 mg/dL (ref >39.00)
LDL CALC: 187 mg/dL — AB (ref 0–99)
NonHDL: 203.21
TRIGLYCERIDES: 83 mg/dL (ref 0.0–149.0)
VLDL: 16.6 mg/dL (ref 0.0–40.0)

## 2018-04-16 NOTE — Patient Instructions (Addendum)
BEFORE YOU LEAVE: -labs -? dexa -follow up: 4-6 months   We have ordered labs or studies at this visit. It can take up to 1-2 weeks for results and processing. IF results require follow up or explanation, we will call you with instructions. Clinically stable results will be released to your [facility]. If you have not heard from [place] or cannot find your results in [facility] in 2 weeks please contact our office at [phone].  If you are not yet signed up for [facility], please consider signing up.    We recommend the following healthy lifestyle for LIFE: 1) Small portions. But, make sure to get regular (at least 3 per day), healthy meals and small healthy snacks if needed.  2) Eat a healthy clean diet.   TRY TO EAT: -at least 5-7 servings of low sugar, colorful, and nutrient rich vegetables per day (not corn, potatoes or bananas.) -berries are the best choice if you wish to eat fruit (only eat small amounts if trying to reduce weight)  -lean meets (fish, white meat of chicken or [place]) -vegan proteins for some meals - beans or tofu, whole grains, nuts and seeds -Replace bad fats with good fats - good fats include: fish, nuts and seeds, canola oil, olive oil -small amounts of low fat or non fat dairy -small amounts of100 % whole grains - check the lables -drink plenty of water  AVOID: -SUGAR, sweets, anything with added sugar, corn syrup or sweeteners - must read labels as even foods advertised as "healthy" often are loaded with sugar -if you must have a sweetener, small amounts of stevia may be best -sweetened beverages and artificially sweetened beverages -simple starches (rice, bread, potatoes, pasta, chips, etc - small amounts of 100% whole grains are ok) -red meat, pork, butter -fried foods, fast food, processed food, excessive dairy, eggs and coconut.  3)Get at least 150 minutes of sweaty aerobic exercise per week.  4)Reduce stress - consider counseling, meditation and relaxation to  balance other aspects of your life.

## 2018-04-19 DIAGNOSIS — H52203 Unspecified astigmatism, bilateral: Secondary | ICD-10-CM | POA: Diagnosis not present

## 2018-04-19 DIAGNOSIS — H5213 Myopia, bilateral: Secondary | ICD-10-CM | POA: Diagnosis not present

## 2018-04-19 DIAGNOSIS — H524 Presbyopia: Secondary | ICD-10-CM | POA: Diagnosis not present

## 2018-04-19 DIAGNOSIS — H2513 Age-related nuclear cataract, bilateral: Secondary | ICD-10-CM | POA: Diagnosis not present

## 2018-04-24 MED ORDER — ATORVASTATIN CALCIUM 10 MG PO TABS: 10.0000 mg | ORAL_TABLET | Freq: Every day | ORAL | 1 refills | Status: DC

## 2018-04-24 NOTE — Addendum Note (Signed)
Addended by: Agnes Lawrence on: 04/24/2018 11:18 AM   Modules accepted: Orders

## 2018-06-15 ENCOUNTER — Encounter: Payer: Self-pay | Admitting: Family Medicine

## 2018-06-15 ENCOUNTER — Ambulatory Visit (INDEPENDENT_AMBULATORY_CARE_PROVIDER_SITE_OTHER): Payer: Medicare HMO | Admitting: Internal Medicine

## 2018-06-15 VITALS — BP 124/82 | HR 69 | Temp 98.2°F | Ht 60.5 in | Wt 98.4 lb

## 2018-06-15 DIAGNOSIS — H6122 Impacted cerumen, left ear: Secondary | ICD-10-CM

## 2018-06-15 NOTE — Progress Notes (Signed)
Established Patient Office Visit    CC/Reason for Visit: clogged left ear  HPI: Diane Becker is a 78 y.o. female who is coming in today for the above mentioned reason. Patient came in c/o left ear being clogged with wax.  Patient sts that it sounds like shes in a "barrel" when people talk to her.  She has been using OTC ear drops at home with no relief.  Patient denies fever or pain.     Past Medical/Surgical History: Past Medical History:  Diagnosis Date  . Colitis, collagenous    Followed by Dr. Collene Mares at [place] in GI  . Colon polyps   . Osteopenia    on > 5 years of bisphosphonate in the past  . Rosacea    sees dermatologist Dr. Ubaldo Glassing for this  . Thyroid disease     Past Surgical History:  Procedure Laterality Date  . ABDOMINAL HYSTERECTOMY  1984  . ABDOMINAL HYSTERECTOMY     for menorrhagia, no cervix per pt  . THYROID SURGERY  2009   Dr. Margot Chimes     Social History:  reports that she has never smoked. She has never used smokeless tobacco. She reports that she does not drink alcohol or use drugs.  Allergies: Allergies  Allergen Reactions  . Penicillins Hives  . Amoxicillin Rash  . Other Rash    "Cillins"    Family History:  Family History  Problem Relation Age of Onset  . Colon cancer Unknown      Current Outpatient Medications:  .  atorvastatin (LIPITOR) 10 MG tablet, Take 1 tablet (10 mg total) by mouth daily., Disp: 90 tablet, Rfl: 1 .  Azelaic Acid (FINACEA) 15 % cream, Apply topically as needed. After skin is thoroughly washed and patted dry, gently but thoroughly massage a thin film of azelaic acid cream into the affected area twice daily, in the morning and evening., Disp: , Rfl:  .  BIOTIN 5000 PO, Take by mouth daily., Disp: , Rfl:  .  Cholecalciferol (VITAMIN D3 PO), Take 1,000 Units by mouth daily., Disp: , Rfl:  .  levothyroxine (SYNTHROID, LEVOTHROID) 75 MCG tablet, Take 1 tablet daily before breakfast, Disp: 90 tablet, Rfl: 3 .   Probiotic Product (PROBIOTIC DAILY PO), Take by mouth daily., Disp: , Rfl:  .  tetracycline (ACHROMYCIN,SUMYCIN) 500 MG capsule, Take 500 mg by mouth. Every 4 days, Disp: , Rfl:   Review of Systems:  Constitutional: "I just cant unclog this ear" Eyes: denies blurry or double vision or difficulty seeing ENMT: c/o a clogged left ear, denies drainage from nose, denies throat pain or dental issues Respiratory: denies wheezing, no crackles or accessory muscle use.  Cardiovascular: denies shortness of breath, chest pain or a fast heart rate, denies extremeity swelling   Physical Exam: Vitals:   06/15/18 1451  BP: 124/82  Pulse: 69  Temp: 98.2 F (36.8 C)  TempSrc: Oral  SpO2: 97%  Weight: 98 lb 6 oz (44.6 kg)  Height: 5' 0.5" (1.537 m)    Body mass index is 18.9 kg/m.   Constitutional: NAD, calm, comfortable Eyes: PERRL, lids and conjunctivae normal ENMT: Mucous membranes are slightly pale. Posterior pharynx clear of any exudate or lesions. Normal dentition. Tympanic membrane is pearly white, no erythema or bulging in right ear.  Large amount of cerum to left ear prior to irigation.  After irrigation, reassessed left ear:  Ear is very reddened and skin irritated, most cerum was removed, but large  amount of grey protrodance coming from right ear.   Respiratory: clear to auscultation bilaterally, no wheezing, no crackles. Normal respiratory effort. No accessory muscle use.  Cardiovascular: Regular rate and rhythm, no murmurs / rubs / gallops. No extremity edema. 2+ pedal pulses.    Impression and Plan:  Left cerumen impaction-irrigation of left ear, pt tolerated well -Referral to ENT for futher assessment  -Call the office if you do not have an appt with ENT within 2 wks    Patient Instructions  Great seeing you today.  -use debrox ear drops weekly or as needed -Referral to ENT has been placed. Call us back in 2 weeks if you have not heard from [place].     Enzo Bi,  RN DNP student [facility] at [facility]

## 2018-06-15 NOTE — Patient Instructions (Addendum)
Great seeing you today.  -use debrox ear drops weekly or as needed -Referral to ENT has been placed. Call us back in 2 weeks if you have not heard from [place].

## 2018-08-01 NOTE — Progress Notes (Signed)
HPI:  Using dictation device. Unfortunately this device frequently misinterprets words/phrases.  Diane Becker is a pleasant 78 y.o. here for follow up. Chronic medical problems summarized below were reviewed for changes and stability and were updated as needed below. These issues and their treatment remain stable for the most part. A statin was started at her last visit. Reports doing well. Working in yard and will be more active as the weather gets better. Denies CP, SOB, DOE, treatment intolerance or new symptoms. Due for labs.  Hypothyroidism: -levothyroxine  Hyperlipidemia/Hyperglycmia: -declined medication -preferred to work on diet and exercise instead  Collitis, collagenous: -managed by Dr. Collene Mares, GI  Osteopenia: -dexa in 03/2016  ROS: See pertinent positives and negatives per HPI.  Past Medical History:  Diagnosis Date  . Colitis, collagenous    Followed by Dr. Collene Mares at [facility] in GI  . Colon polyps   . Osteopenia    on > 5 years of bisphosphonate in the past  . Rosacea    sees dermatologist Dr. Ubaldo Glassing for this  . Thyroid disease     Past Surgical History:  Procedure Laterality Date  . ABDOMINAL HYSTERECTOMY  1984  . ABDOMINAL HYSTERECTOMY     for menorrhagia, no cervix per pt  . THYROID SURGERY  2009   Dr. Margot Chimes     Family History  Problem Relation Age of Onset  . Colon cancer Unknown     SOCIAL HX: see hpi   Current Outpatient Medications:  .  atorvastatin (LIPITOR) 10 MG tablet, Take 1 tablet (10 mg total) by mouth daily., Disp: 90 tablet, Rfl: 1 .  Azelaic Acid (FINACEA) 15 % cream, Apply topically as needed. After skin is thoroughly washed and patted dry, gently but thoroughly massage a thin film of azelaic acid cream into the affected area twice daily, in the morning and evening., Disp: , Rfl:  .  BIOTIN 5000 PO, Take by mouth daily., Disp: , Rfl:  .  Cholecalciferol (VITAMIN D3 PO), Take 1,000 Units by mouth daily., Disp: , Rfl:  .   levothyroxine (SYNTHROID, LEVOTHROID) 75 MCG tablet, Take 1 tablet daily before breakfast, Disp: 90 tablet, Rfl: 3 .  Probiotic Product (PROBIOTIC DAILY PO), Take by mouth daily., Disp: , Rfl:  .  tetracycline (ACHROMYCIN,SUMYCIN) 500 MG capsule, Take 500 mg by mouth. Every 4 days, Disp: , Rfl:   EXAM:  Vitals:   08/02/18 0707  BP: (!) 120/58  Pulse: (!) 55  Temp: 98.1 F (36.7 C)    Body mass index is 18.9 kg/m.  GENERAL: vitals reviewed and listed above, alert, oriented, appears well hydrated and in no acute distress  HEENT: atraumatic, conjunttiva clear, no obvious abnormalities on inspection of external nose and ears  NECK: no obvious masses on inspection  LUNGS: clear to auscultation bilaterally, no wheezes, rales or rhonchi, good air movement  CV: HRRR, no peripheral edema  SKIN: cherry angioma and SK on L flank  MS: moves all extremities without noticeable abnormality  PSYCH: pleasant and cooperative, no obvious depression or anxiety  ASSESSMENT AND PLAN:  Discussed the following assessment and plan:  Hyperlipidemia, unspecified hyperlipidemia type - Plan: Lipid panel  Hypothyroidism, unspecified type - Plan: TSH  Hyperglycemia  Osteopenia, unspecified location  Seborrheic keratoses  -labs per orders -lifestyle recs -offered bone density - she declined for now -discussed skin lesion most likely SK and options for further eval -follow up 4 months, sooner as needed  Patient Instructions  BEFORE YOU LEAVE: -labs -it  has been 2 years since her bone density, would she like [place] to order? Ok to do so. -follow up: 4 months  We have ordered labs or studies at this visit. It can take up to 1-2 weeks for results and processing. IF results require follow up or explanation, we will call you with instructions. Clinically stable results will be released to your [facility]. If you have not heard from [place] or cannot find your results in [facility] in 2 weeks please contact our  office at [phone].  If you are not yet signed up for [facility], please consider signing up.           Lucretia Kern, DO

## 2018-08-02 ENCOUNTER — Ambulatory Visit (INDEPENDENT_AMBULATORY_CARE_PROVIDER_SITE_OTHER): Payer: Medicare HMO | Admitting: Family Medicine

## 2018-08-02 ENCOUNTER — Encounter: Payer: Self-pay | Admitting: Family Medicine

## 2018-08-02 VITALS — BP 120/58 | HR 55 | Temp 98.1°F | Ht 60.5 in | Wt 98.4 lb

## 2018-08-02 DIAGNOSIS — R739 Hyperglycemia, unspecified: Secondary | ICD-10-CM | POA: Diagnosis not present

## 2018-08-02 DIAGNOSIS — E039 Hypothyroidism, unspecified: Secondary | ICD-10-CM

## 2018-08-02 DIAGNOSIS — M8580 Other specified disorders of bone density and structure, unspecified site: Secondary | ICD-10-CM

## 2018-08-02 DIAGNOSIS — L821 Other seborrheic keratosis: Secondary | ICD-10-CM

## 2018-08-02 DIAGNOSIS — E785 Hyperlipidemia, unspecified: Secondary | ICD-10-CM

## 2018-08-02 LAB — LIPID PANEL
CHOLESTEROL: 182 mg/dL (ref 0–200)
HDL: 79.2 mg/dL (ref >39.00)
LDL CALC: 93 mg/dL (ref 0–99)
NONHDL: 103.1
Total CHOL/HDL Ratio: 2
Triglycerides: 51 mg/dL (ref 0.0–149.0)
VLDL: 10.2 mg/dL (ref 0.0–40.0)

## 2018-08-02 LAB — TSH: TSH: 0.9 u[IU]/mL (ref 0.35–4.50)

## 2018-08-02 NOTE — Patient Instructions (Signed)
BEFORE YOU LEAVE: -labs -it has been 2 years since her bone density, would she like [place] to order? Ok to do so. -follow up: 4 months  We have ordered labs or studies at this visit. It can take up to 1-2 weeks for results and processing. IF results require follow up or explanation, we will call you with instructions. Clinically stable results will be released to your [facility]. If you have not heard from [place] or cannot find your results in [facility] in 2 weeks please contact our office at [phone].  If you are not yet signed up for [facility], please consider signing up.

## 2018-08-20 ENCOUNTER — Encounter: Payer: Self-pay | Admitting: Family Medicine

## 2018-08-21 MED ORDER — ATORVASTATIN CALCIUM 10 MG PO TABS: 10.0000 mg | ORAL_TABLET | Freq: Every day | ORAL | 1 refills | Status: DC

## 2018-08-21 MED ORDER — LEVOTHYROXINE SODIUM 75 MCG PO TABS: ORAL_TABLET | ORAL | 1 refills | Status: DC

## 2018-09-13 ENCOUNTER — Other Ambulatory Visit: Payer: Self-pay

## 2018-09-13 ENCOUNTER — Ambulatory Visit (INDEPENDENT_AMBULATORY_CARE_PROVIDER_SITE_OTHER): Payer: Medicare HMO | Admitting: Internal Medicine

## 2018-09-13 DIAGNOSIS — K52831 Collagenous colitis: Secondary | ICD-10-CM | POA: Diagnosis not present

## 2018-09-13 DIAGNOSIS — E785 Hyperlipidemia, unspecified: Secondary | ICD-10-CM | POA: Diagnosis not present

## 2018-09-13 DIAGNOSIS — E039 Hypothyroidism, unspecified: Secondary | ICD-10-CM

## 2018-09-13 NOTE — Progress Notes (Signed)
Virtual Visit via Video Note  I connected with Diane Becker on 09/13/18 at  1:30 PM EDT by a video enabled telemedicine application and verified that I am speaking with the correct person using two identifiers.  Location patient: home Location provider: work office Persons participating in the virtual visit: patient, provider  I discussed the limitations of evaluation and management by telemedicine and the availability of in person appointments. The patient expressed understanding and agreed to proceed.   HPI: This visit is to establish care and to follow up on chronic conditions.  PMH is significant for:  1. HLD, just started on atorvastatin 10 mg in Feb, doing well without side effects.  2. Hypothyroidism on synthroid, without recent dosing changes.  3. Collagenous colitis and colon polyps, followed by Dr. Collene Mares. Currently asymptomatic and not on any medications.  She is a never smoker. Has a glass of wine every evening. No acute complaints today.   ROS: Constitutional: Denies fever, chills, diaphoresis, appetite change and fatigue.  HEENT: Denies photophobia, eye pain, redness, hearing loss, ear pain, congestion, sore throat, rhinorrhea, sneezing, mouth sores, trouble swallowing, neck pain, neck stiffness and tinnitus.   Respiratory: Denies SOB, DOE, cough, chest tightness,  and wheezing.   Cardiovascular: Denies chest pain, palpitations and leg swelling.  Gastrointestinal: Denies nausea, vomiting, abdominal pain, diarrhea, constipation, blood in stool and abdominal distention.  Genitourinary: Denies dysuria, urgency, frequency, hematuria, flank pain and difficulty urinating.  Endocrine: Denies: hot or cold intolerance, sweats, changes in hair or nails, polyuria, polydipsia. Musculoskeletal: Denies myalgias, back pain, joint swelling, arthralgias and gait problem.  Skin: Denies pallor, rash and wound.  Neurological: Denies dizziness, seizures, syncope, weakness,  light-headedness, numbness and headaches.  Hematological: Denies adenopathy. Easy bruising, personal or family bleeding history  Psychiatric/Behavioral: Denies suicidal ideation, mood changes, confusion, nervousness, sleep disturbance and agitation   Past Medical History:  Diagnosis Date  . Colitis, collagenous    Followed by Dr. Collene Mares at [facility] in GI  . Colon polyps   . Osteopenia    on > 5 years of bisphosphonate in the past  . Rosacea    sees dermatologist Dr. Ubaldo Glassing for this  . Thyroid disease     Past Surgical History:  Procedure Laterality Date  . ABDOMINAL HYSTERECTOMY  1984  . ABDOMINAL HYSTERECTOMY     for menorrhagia, no cervix per pt  . THYROID SURGERY  2009   Dr. Margot Chimes     Family History  Problem Relation Age of Onset  . Colon cancer Unknown     SOCIAL HX:   reports that she has never smoked. She has never used smokeless tobacco. She reports that she does not drink alcohol or use drugs.   Current Outpatient Medications:  .  atorvastatin (LIPITOR) 10 MG tablet, Take 1 tablet (10 mg total) by mouth daily., Disp: 90 tablet, Rfl: 1 .  Azelaic Acid (FINACEA) 15 % cream, Apply topically as needed. After skin is thoroughly washed and patted dry, gently but thoroughly massage a thin film of azelaic acid cream into the affected area twice daily, in the morning and evening., Disp: , Rfl:  .  BIOTIN 5000 PO, Take by mouth daily., Disp: , Rfl:  .  Cholecalciferol (VITAMIN D3 PO), Take 1,000 Units by mouth daily., Disp: , Rfl:  .  levothyroxine (SYNTHROID, LEVOTHROID) 75 MCG tablet, Take 1 tablet daily before breakfast, Disp: 90 tablet, Rfl: 1 .  Probiotic Product (PROBIOTIC DAILY PO), Take by mouth daily.,  Disp: , Rfl:  .  tetracycline (ACHROMYCIN,SUMYCIN) 500 MG capsule, Take 500 mg by mouth. Every 4 days, Disp: , Rfl:   EXAM:   VITALS per patient if applicable: reported by patient today: BP 128/61, HR 50, T 98.2  GENERAL: alert, oriented, appears well and in  no acute distress  HEENT: atraumatic, conjunttiva clear, no obvious abnormalities on inspection of external nose and ears  NECK: normal movements of the head and neck  LUNGS: on inspection no signs of respiratory distress, breathing rate appears normal, no obvious gross increased work of breathing, gasping or wheezing  CV: no obvious cyanosis  MS: moves all visible extremities without noticeable abnormality  PSYCH/NEURO: pleasant and cooperative, no obvious depression or anxiety, speech and thought processing grossly intact  ASSESSMENT AND PLAN:   Hypothyroidism, unspecified type -Recent TSH WNL. -continue current synthroid dose.  Hyperlipidemia, unspecified hyperlipidemia type -Recently started on statin, no myalgias reported.  Colitis, collagenous  - followed by Dr. Collene Mares at [facility] -currently asymptomatic and not on meds  Colon Polyps -On a q 5 year colonoscopy schedule, last done in 2018.     I discussed the assessment and treatment plan with the patient. The patient was provided an opportunity to ask questions and all were answered. The patient agreed with the plan and demonstrated an understanding of the instructions.   The patient was advised to call back or seek an in-person evaluation if the symptoms worsen or if the condition fails to improve as anticipated.    Lelon Frohlich, MD  [facility] at [facility]

## 2018-12-14 ENCOUNTER — Ambulatory Visit: Payer: Medicare HMO | Admitting: Internal Medicine

## 2018-12-21 ENCOUNTER — Ambulatory Visit (INDEPENDENT_AMBULATORY_CARE_PROVIDER_SITE_OTHER): Payer: Medicare HMO | Admitting: Internal Medicine

## 2018-12-21 ENCOUNTER — Encounter: Payer: Self-pay | Admitting: Internal Medicine

## 2018-12-21 VITALS — BP 120/70 | HR 60 | Temp 98.8°F | Wt 96.3 lb

## 2018-12-21 DIAGNOSIS — Z13820 Encounter for screening for osteoporosis: Secondary | ICD-10-CM | POA: Diagnosis not present

## 2018-12-21 DIAGNOSIS — R200 Anesthesia of skin: Secondary | ICD-10-CM

## 2018-12-21 DIAGNOSIS — K52831 Collagenous colitis: Secondary | ICD-10-CM | POA: Diagnosis not present

## 2018-12-21 DIAGNOSIS — R634 Abnormal weight loss: Secondary | ICD-10-CM | POA: Diagnosis not present

## 2018-12-21 DIAGNOSIS — E039 Hypothyroidism, unspecified: Secondary | ICD-10-CM | POA: Diagnosis not present

## 2018-12-21 DIAGNOSIS — H6692 Otitis media, unspecified, left ear: Secondary | ICD-10-CM | POA: Diagnosis not present

## 2018-12-21 DIAGNOSIS — E785 Hyperlipidemia, unspecified: Secondary | ICD-10-CM | POA: Diagnosis not present

## 2018-12-21 DIAGNOSIS — Z0000 Encounter for general adult medical examination without abnormal findings: Secondary | ICD-10-CM

## 2018-12-21 DIAGNOSIS — Z634 Disappearance and death of family member: Secondary | ICD-10-CM

## 2018-12-21 LAB — COMPREHENSIVE METABOLIC PANEL
ALT: 20 U/L (ref 0–35)
AST: 21 U/L (ref 0–37)
Albumin: 4.5 g/dL (ref 3.5–5.2)
Alkaline Phosphatase: 63 U/L (ref 39–117)
BUN: 20 mg/dL (ref 6–23)
CO2: 27 mEq/L (ref 19–32)
Calcium: 9.6 mg/dL (ref 8.4–10.5)
Chloride: 104 mEq/L (ref 96–112)
Creatinine, Ser: 1.03 mg/dL (ref 0.40–1.20)
GFR: 52.12 mL/min — ABNORMAL LOW (ref >60.00)
Glucose, Bld: 92 mg/dL (ref 70–99)
Potassium: 4.8 mEq/L (ref 3.5–5.1)
Sodium: 139 mEq/L (ref 135–145)
Total Bilirubin: 1.1 mg/dL (ref 0.2–1.2)
Total Protein: 7 g/dL (ref 6.0–8.3)

## 2018-12-21 LAB — LIPID PANEL
Cholesterol: 250 mg/dL — ABNORMAL HIGH (ref 0–200)
HDL: 93.8 mg/dL (ref >39.00)
LDL Cholesterol: 141 mg/dL — ABNORMAL HIGH (ref 0–99)
NonHDL: 156.46
Total CHOL/HDL Ratio: 3
Triglycerides: 79 mg/dL (ref 0.0–149.0)
VLDL: 15.8 mg/dL (ref 0.0–40.0)

## 2018-12-21 LAB — CBC WITH DIFFERENTIAL/PLATELET
Basophils Absolute: 0.1 10*3/uL (ref 0.0–0.1)
Basophils Relative: 1.5 % (ref 0.0–3.0)
Eosinophils Absolute: 0.3 10*3/uL (ref 0.0–0.7)
Eosinophils Relative: 5.9 % — ABNORMAL HIGH (ref 0.0–5.0)
HCT: 43 % (ref 36.0–46.0)
Hemoglobin: 14.4 g/dL (ref 12.0–15.0)
Lymphocytes Relative: 31 % (ref 12.0–46.0)
Lymphs Abs: 1.4 10*3/uL (ref 0.7–4.0)
MCHC: 33.5 g/dL (ref 30.0–36.0)
MCV: 92.5 fl (ref 78.0–100.0)
Monocytes Absolute: 0.5 10*3/uL (ref 0.1–1.0)
Monocytes Relative: 9.9 % (ref 3.0–12.0)
Neutro Abs: 2.4 10*3/uL (ref 1.4–7.7)
Neutrophils Relative %: 51.7 % (ref 43.0–77.0)
Platelets: 268 10*3/uL (ref 150.0–400.0)
RBC: 4.65 Mil/uL (ref 3.87–5.11)
RDW: 13.6 % (ref 11.5–15.5)
WBC: 4.6 10*3/uL (ref 4.0–10.5)

## 2018-12-21 LAB — VITAMIN D 25 HYDROXY (VIT D DEFICIENCY, FRACTURES): VITD: 38.44 ng/mL (ref 30.00–100.00)

## 2018-12-21 LAB — VITAMIN B12: Vitamin B-12: 213 pg/mL (ref 211–911)

## 2018-12-21 LAB — TSH: TSH: 0.46 u[IU]/mL (ref 0.35–4.50)

## 2018-12-21 LAB — HEMOGLOBIN A1C: Hgb A1c MFr Bld: 5.8 % (ref 4.6–6.5)

## 2018-12-21 MED ORDER — LEVOFLOXACIN 500 MG PO TABS: 500.0000 mg | ORAL_TABLET | Freq: Every day | ORAL | 0 refills | Status: DC

## 2018-12-21 NOTE — Patient Instructions (Addendum)
-Nice meeting you today!!  -Levaquin 500 mg daily for your left ear infection for 7 days.  -DEXA to be requested.  -Labs today; will notify you once results are available.   Preventive Care 99 Years and Older, Female Preventive care refers to lifestyle choices and visits with your health care provider that can promote health and wellness. This includes:  A yearly physical exam. This is also called an annual well check.  Regular dental and eye exams.  Immunizations.  Screening for certain conditions.  Healthy lifestyle choices, such as diet and exercise. What can I expect for my preventive care visit? Physical exam Your health care provider will check:  Height and weight. These may be used to calculate body mass index (BMI), which is a measurement that tells if you are at a healthy weight.  Heart rate and blood pressure.  Your skin for abnormal spots. Counseling Your health care provider may ask you questions about:  Alcohol, tobacco, and drug use.  Emotional well-being.  Home and relationship well-being.  Sexual activity.  Eating habits.  History of falls.  Memory and ability to understand (cognition).  Work and work Statistician.  Pregnancy and menstrual history. What immunizations do I need?  Influenza (flu) vaccine  This is recommended every year. Tetanus, diphtheria, and pertussis (Tdap) vaccine  You may need a Td booster every 10 years. Varicella (chickenpox) vaccine  You may need this vaccine if you have not already been vaccinated. Zoster (shingles) vaccine  You may need this after age 69. Pneumococcal conjugate (PCV13) vaccine  One dose is recommended after age 50. Pneumococcal polysaccharide (PPSV23) vaccine  One dose is recommended after age 64. Measles, mumps, and rubella (MMR) vaccine  You may need at least one dose of MMR if you were born in 1957 or later. You may also need a second dose. Meningococcal conjugate (MenACWY)  vaccine  You may need this if you have certain conditions. Hepatitis A vaccine  You may need this if you have certain conditions or if you travel or work in places where you may be exposed to hepatitis A. Hepatitis B vaccine  You may need this if you have certain conditions or if you travel or work in places where you may be exposed to hepatitis B. Haemophilus influenzae type b (Hib) vaccine  You may need this if you have certain conditions. You may receive vaccines as individual doses or as more than one vaccine together in one shot (combination vaccines). Talk with your health care provider about the risks and benefits of combination vaccines. What tests do I need? Blood tests  Lipid and cholesterol levels. These may be checked every 5 years, or more frequently depending on your overall health.  Hepatitis C test.  Hepatitis B test. Screening  Lung cancer screening. You may have this screening every year starting at age 64 if you have a 30-pack-year history of smoking and currently smoke or have quit within the past 15 years.  Colorectal cancer screening. All adults should have this screening starting at age 12 and continuing until age 72. Your health care provider may recommend screening at age 54 if you are at increased risk. You will have tests every 1-10 years, depending on your results and the type of screening test.  Diabetes screening. This is done by checking your blood sugar (glucose) after you have not eaten for a while (fasting). You may have this done every 1-3 years.  Mammogram. This may be done every 1-2 years. Talk  with your health care provider about how often you should have regular mammograms.  BRCA-related cancer screening. This may be done if you have a family history of breast, ovarian, tubal, or peritoneal cancers. Other tests  Sexually transmitted disease (STD) testing.  Bone density scan. This is done to screen for osteoporosis. You may have this done  starting at age 90. Follow these instructions at home: Eating and drinking  Eat a diet that includes fresh fruits and vegetables, whole grains, lean protein, and low-fat dairy products. Limit your intake of foods with high amounts of sugar, saturated fats, and salt.  Take vitamin and mineral supplements as recommended by your health care provider.  Do not drink alcohol if your health care provider tells you not to drink.  If you drink alcohol: ? Limit how much you have to 0-1 drink a day. ? Be aware of how much alcohol is in your drink. In the [place] one drink equals one 12 oz bottle of beer (355 mL), one 5 oz glass of wine (148 mL), or one 1 oz glass of hard liquor (44 mL). Lifestyle  Take daily care of your teeth and gums.  Stay active. Exercise for at least 30 minutes on 5 or more days each week.  Do not use any products that contain nicotine or tobacco, such as cigarettes, e-cigarettes, and chewing tobacco. If you need help quitting, ask your health care provider.  If you are sexually active, practice safe sex. Use a condom or other form of protection in order to prevent STIs (sexually transmitted infections).  Talk with your health care provider about taking a low-dose aspirin or statin. What's next?  Go to your health care provider once a year for a well check visit.  Ask your health care provider how often you should have your eyes and teeth checked.  Stay up to date on all vaccines. This information is not intended to replace advice given to you by your health care provider. Make sure you discuss any questions you have with your health care provider. Document Released: 06/12/2015 Document Revised: 05/10/2018 Document Reviewed: 05/10/2018 Elsevier Patient Education  2020 [employer].

## 2018-12-21 NOTE — Progress Notes (Signed)
Established Patient Office Visit     CC/Reason for Visit: Annual preventive exam and subsequent Medicare wellness visit  HPI: Diane Becker is a 78 y.o. female who is coming in today for the above mentioned reasons. Past Medical History is significant for:   1. HLD, just started on atorvastatin 10 mg in Feb, doing well without side effects.  2. Hypothyroidism on synthroid, without recent dosing changes.  3. Collagenous colitis and colon polyps, followed by Dr. Collene Mares. Currently asymptomatic and not on any medications.  She has some acute complaints today: 1.  She has been having some pain in her left ear.  2.  She would like her DEXA scan ordered  3.  Her brother died unexpectedly in 10/23/2022 and she has been very depressed.  4.  She has been having a hard time gaining weight.  She has always been a slim person, at her highest weight was 108 throughout her lifetime.  Past Medical/Surgical History: Past Medical History:  Diagnosis Date   Colitis, collagenous    Followed by Dr. Collene Mares at [facility] in GI   Colon polyps    Osteopenia    on > 5 years of bisphosphonate in the past   Rosacea    sees dermatologist Dr. Ubaldo Glassing for this   Thyroid disease     Past Surgical History:  Procedure Laterality Date   ABDOMINAL HYSTERECTOMY  1984   ABDOMINAL HYSTERECTOMY     for menorrhagia, no cervix per pt   THYROID SURGERY  2009   Dr. Margot Chimes     Social History:  reports that she has never smoked. She has never used smokeless tobacco. She reports that she does not drink alcohol or use drugs.  Allergies: Allergies  Allergen Reactions   Penicillins Hives   Amoxicillin Rash   Other Rash    "Cillins"    Family History:  Family History  Problem Relation Age of Onset   Colon cancer Unknown      Current Outpatient Medications:    atorvastatin (LIPITOR) 10 MG tablet, Take 1 tablet (10 mg total) by mouth daily., Disp: 90 tablet, Rfl: 1   Azelaic Acid  (FINACEA) 15 % cream, Apply topically as needed. After skin is thoroughly washed and patted dry, gently but thoroughly massage a thin film of azelaic acid cream into the affected area twice daily, in the morning and evening., Disp: , Rfl:    BIOTIN 5000 PO, Take by mouth daily., Disp: , Rfl:    Cholecalciferol (VITAMIN D3 PO), Take 1,000 Units by mouth daily., Disp: , Rfl:    levofloxacin (LEVAQUIN) 500 MG tablet, Take 1 tablet (500 mg total) by mouth daily., Disp: 7 tablet, Rfl: 0   levothyroxine (SYNTHROID, LEVOTHROID) 75 MCG tablet, Take 1 tablet daily before breakfast, Disp: 90 tablet, Rfl: 1   Probiotic Product (PROBIOTIC DAILY PO), Take by mouth daily., Disp: , Rfl:    tetracycline (ACHROMYCIN,SUMYCIN) 500 MG capsule, Take 500 mg by mouth. Every 4 days, Disp: , Rfl:   Review of Systems:  Constitutional: Denies fever, chills, diaphoresis, appetite change and fatigue.  HEENT: Denies photophobia, eye pain, redness, hearing loss,congestion, sore throat, rhinorrhea, sneezing, mouth sores, trouble swallowing, neck pain, neck stiffness and tinnitus.   Respiratory: Denies SOB, DOE, cough, chest tightness,  and wheezing.   Cardiovascular: Denies chest pain, palpitations and leg swelling.  Gastrointestinal: Denies nausea, vomiting, abdominal pain, diarrhea, constipation, blood in stool and abdominal distention.  Genitourinary: Denies dysuria, urgency, frequency, hematuria,  flank pain and difficulty urinating.  Endocrine: Denies: hot or cold intolerance, sweats, changes in hair or nails, polyuria, polydipsia. Musculoskeletal: Denies myalgias, back pain, joint swelling, arthralgias and gait problem.  Skin: Denies pallor, rash and wound.  Neurological: Denies dizziness, seizures, syncope, weakness, light-headedness, numbness and headaches.  Hematological: Denies adenopathy. Easy bruising, personal or family bleeding history  Psychiatric/Behavioral: Denies suicidal ideation, mood changes,  confusion, nervousness, sleep disturbance and agitation    Physical Exam: Vitals:   12/21/18 1327  BP: 120/70  Pulse: 60  Temp: 98.8 F (37.1 C)  TempSrc: Oral  Weight: 96 lb 4.8 oz (43.7 kg)    Body mass index is 18.5 kg/m.   Constitutional: NAD, calm, comfortable Eyes: PERRL, lids and conjunctivae normal, wears corrective lenses ENMT: Mucous membranes are moist.Tympanic membrane is pearly white, no erythema or bulging on the right, on the left has erythema of the tympanic membrane with bulging and air-fluid level. Neck: normal, supple, no masses, no thyromegaly Respiratory: clear to auscultation bilaterally, no wheezing, no crackles. Normal respiratory effort. No accessory muscle use.  Cardiovascular: Regular rate and rhythm, no murmurs / rubs / gallops. No extremity edema. 2+ pedal pulses. No carotid bruits.  Abdomen: no tenderness, no masses palpated. No hepatosplenomegaly. Bowel sounds positive.  Musculoskeletal: no clubbing / cyanosis. No joint deformity upper and lower extremities. Good ROM, no contractures. Normal muscle tone.  Skin: no rashes, lesions, ulcers. No induration Neurologic: CN 2-12 grossly intact. Sensation intact, DTR normal. Strength 5/5 in all 4.  Psychiatric: Normal judgment and insight. Alert and oriented x 3. Normal mood.   Subsequent Medicare wellness visit   1. Risk factors, based on past  M,S,F -cardiovascular disease risk factors include age only   2.  Physical activities: She remains physically active walking and doing house and yard work   3.  Depression/mood:  She is a little depressed today following the unexpected death of her brother 2 months ago   4.  Hearing:  No issues   5.  ADL's: Independent in all ADLs   6.  Fall risk:  Low fall risk   7.  Home safety: No problems identified   8.  Height weight, and visual acuity: Height and weight as above visual acuity is 20/30 corrected   9.  Counseling:  Advised nutritional supplements  for weight gain like Ensure or boost   10. Lab orders based on risk factors: Laboratory update will be reviewed   11. Referral :  None today   12. Care plan:  Antibiotic for ear infection, follow-up in 6 months   13. Cognitive assessment:  No cognitive impairment   14. Screening: Patient provided with a written and personalized 5-10 year screening schedule in the AVS.   yes   15. Provider List Update:   PCP  16. Advance Directives: Full code     Office Visit from 12/21/2018 in [facility] [place]  PHQ-9 Total Score  3      Fall Risk  12/07/2017 11/29/2016 11/29/2016 11/26/2015 08/28/2014  Falls in the past year? No No No No Yes  Number falls in past yr: - - - - 1  Injury with Fall? - - - - Yes     Impression and Plan:  Encounter for preventive health examination -She has routine eye and dental care. -Immunizations are up-to-date. -With her age okay to forego further cancer screening. -Screening labs to be performed today. -Healthy lifestyle has been discussed in detail.  Left otitis media, unspecified  otitis media type -Levaquin for 7 days.  Hyperlipidemia, unspecified hyperlipidemia type  -Check lipids, continue atorvastatin.  Hypothyroidism, unspecified type -Check TSH, continue Synthroid.  Colitis, collagenous - followed by Dr. Collene Mares at [facility]  -Have advised her that this is normal bereavement following a traumatic event, she has good family and friend support, can consider counseling sessions if she feels like they are needed.  No need for medications at this point.  Weight loss  -Advised that she take nutritional supplements such as Ensure, boost or resource and eating for at least 2 a day in addition to regular meals.  Right arm numbness -Unclear etiology, she has full range of motion of her shoulder and elbow. -Check TSH and B12.  Screening for osteoporosis - Plan: DG Bone Density,     Patient Instructions  -Nice  meeting you today!!  -Levaquin 500 mg daily for your left ear infection for 7 days.  -DEXA to be requested.  -Labs today; will notify you once results are available.   Preventive Care 19 Years and Older, Female Preventive care refers to lifestyle choices and visits with your health care provider that can promote health and wellness. This includes:  A yearly physical exam. This is also called an annual well check.  Regular dental and eye exams.  Immunizations.  Screening for certain conditions.  Healthy lifestyle choices, such as diet and exercise. What can I expect for my preventive care visit? Physical exam Your health care provider will check:  Height and weight. These may be used to calculate body mass index (BMI), which is a measurement that tells if you are at a healthy weight.  Heart rate and blood pressure.  Your skin for abnormal spots. Counseling Your health care provider may ask you questions about:  Alcohol, tobacco, and drug use.  Emotional well-being.  Home and relationship well-being.  Sexual activity.  Eating habits.  History of falls.  Memory and ability to understand (cognition).  Work and work Statistician.  Pregnancy and menstrual history. What immunizations do I need?  Influenza (flu) vaccine  This is recommended every year. Tetanus, diphtheria, and pertussis (Tdap) vaccine  You may need a Td booster every 10 years. Varicella (chickenpox) vaccine  You may need this vaccine if you have not already been vaccinated. Zoster (shingles) vaccine  You may need this after age 64. Pneumococcal conjugate (PCV13) vaccine  One dose is recommended after age 72. Pneumococcal polysaccharide (PPSV23) vaccine  One dose is recommended after age 35. Measles, mumps, and rubella (MMR) vaccine  You may need at least one dose of MMR if you were born in 1957 or later. You may also need a second dose. Meningococcal conjugate (MenACWY) vaccine  You  may need this if you have certain conditions. Hepatitis A vaccine  You may need this if you have certain conditions or if you travel or work in places where you may be exposed to hepatitis A. Hepatitis B vaccine  You may need this if you have certain conditions or if you travel or work in places where you may be exposed to hepatitis B. Haemophilus influenzae type b (Hib) vaccine  You may need this if you have certain conditions. You may receive vaccines as individual doses or as more than one vaccine together in one shot (combination vaccines). Talk with your health care provider about the risks and benefits of combination vaccines. What tests do I need? Blood tests  Lipid and cholesterol levels. These may be checked  every 5 years, or more frequently depending on your overall health.  Hepatitis C test.  Hepatitis B test. Screening  Lung cancer screening. You may have this screening every year starting at age 25 if you have a 30-pack-year history of smoking and currently smoke or have quit within the past 15 years.  Colorectal cancer screening. All adults should have this screening starting at age 62 and continuing until age 49. Your health care provider may recommend screening at age 8 if you are at increased risk. You will have tests every 1-10 years, depending on your results and the type of screening test.  Diabetes screening. This is done by checking your blood sugar (glucose) after you have not eaten for a while (fasting). You may have this done every 1-3 years.  Mammogram. This may be done every 1-2 years. Talk with your health care provider about how often you should have regular mammograms.  BRCA-related cancer screening. This may be done if you have a family history of breast, ovarian, tubal, or peritoneal cancers. Other tests  Sexually transmitted disease (STD) testing.  Bone density scan. This is done to screen for osteoporosis. You may have this done starting at age  36. Follow these instructions at home: Eating and drinking  Eat a diet that includes fresh fruits and vegetables, whole grains, lean protein, and low-fat dairy products. Limit your intake of foods with high amounts of sugar, saturated fats, and salt.  Take vitamin and mineral supplements as recommended by your health care provider.  Do not drink alcohol if your health care provider tells you not to drink.  If you drink alcohol: ? Limit how much you have to 0-1 drink a day. ? Be aware of how much alcohol is in your drink. In the [place] one drink equals one 12 oz bottle of beer (355 mL), one 5 oz glass of wine (148 mL), or one 1 oz glass of hard liquor (44 mL). Lifestyle  Take daily care of your teeth and gums.  Stay active. Exercise for at least 30 minutes on 5 or more days each week.  Do not use any products that contain nicotine or tobacco, such as cigarettes, e-cigarettes, and chewing tobacco. If you need help quitting, ask your health care provider.  If you are sexually active, practice safe sex. Use a condom or other form of protection in order to prevent STIs (sexually transmitted infections).  Talk with your health care provider about taking a low-dose aspirin or statin. What's next?  Go to your health care provider once a year for a well check visit.  Ask your health care provider how often you should have your eyes and teeth checked.  Stay up to date on all vaccines. This information is not intended to replace advice given to you by your health care provider. Make sure you discuss any questions you have with your health care provider. Document Released: 06/12/2015 Document Revised: 05/10/2018 Document Reviewed: 05/10/2018 Elsevier Patient Education  2020 [place], MD [facility]

## 2018-12-23 ENCOUNTER — Encounter: Payer: Self-pay | Admitting: Internal Medicine

## 2018-12-25 ENCOUNTER — Encounter: Payer: Self-pay | Admitting: Internal Medicine

## 2018-12-25 DIAGNOSIS — E538 Deficiency of other specified B group vitamins: Secondary | ICD-10-CM | POA: Insufficient documentation

## 2018-12-26 ENCOUNTER — Other Ambulatory Visit: Payer: Self-pay | Admitting: *Deleted

## 2018-12-26 MED ORDER — ATORVASTATIN CALCIUM 10 MG PO TABS: 10.0000 mg | ORAL_TABLET | Freq: Every day | ORAL | 1 refills | Status: DC

## 2018-12-26 MED ORDER — LEVOTHYROXINE SODIUM 75 MCG PO TABS: ORAL_TABLET | ORAL | 1 refills | Status: DC

## 2019-01-03 ENCOUNTER — Encounter: Payer: Self-pay | Admitting: Internal Medicine

## 2019-01-04 ENCOUNTER — Encounter: Payer: Self-pay | Admitting: Internal Medicine

## 2019-01-04 MED ORDER — "BD SAFETYGLIDE SYRINGE/NEEDLE 25G X 1"" 3 ML MISC": 11 refills | Status: DC

## 2019-01-04 MED ORDER — CYANOCOBALAMIN 1000 MCG/ML IJ SOLN: INTRAMUSCULAR | 5 refills | Status: DC

## 2019-01-04 MED ORDER — ALCOHOL SWABS PADS: MEDICATED_PAD | 3 refills | Status: DC

## 2019-01-07 ENCOUNTER — Encounter: Payer: Self-pay | Admitting: Internal Medicine

## 2019-01-07 ENCOUNTER — Other Ambulatory Visit: Payer: Self-pay

## 2019-01-07 ENCOUNTER — Ambulatory Visit (INDEPENDENT_AMBULATORY_CARE_PROVIDER_SITE_OTHER): Payer: Medicare HMO | Admitting: *Deleted

## 2019-01-07 DIAGNOSIS — E538 Deficiency of other specified B group vitamins: Secondary | ICD-10-CM | POA: Diagnosis not present

## 2019-01-07 MED ORDER — CYANOCOBALAMIN 1000 MCG/ML IJ SOLN
1000.0000 ug | Freq: Once | INTRAMUSCULAR | Status: AC
  Administered 2019-01-07: 11:00:00 1000 ug via INTRAMUSCULAR

## 2019-01-10 NOTE — Progress Notes (Signed)
Agree with B12 injection as given.  Eulas Post MD [facility] at [facility]

## 2019-01-15 DIAGNOSIS — Z8719 Personal history of other diseases of the digestive system: Secondary | ICD-10-CM | POA: Diagnosis not present

## 2019-01-15 DIAGNOSIS — Z90710 Acquired absence of both cervix and uterus: Secondary | ICD-10-CM | POA: Diagnosis not present

## 2019-01-15 DIAGNOSIS — M8589 Other specified disorders of bone density and structure, multiple sites: Secondary | ICD-10-CM | POA: Diagnosis not present

## 2019-01-15 LAB — HM DEXA SCAN

## 2019-01-22 ENCOUNTER — Encounter: Payer: Self-pay | Admitting: Internal Medicine

## 2019-02-20 DIAGNOSIS — L718 Other rosacea: Secondary | ICD-10-CM | POA: Diagnosis not present

## 2019-02-23 ENCOUNTER — Ambulatory Visit: Payer: Medicare HMO

## 2019-03-09 ENCOUNTER — Ambulatory Visit (INDEPENDENT_AMBULATORY_CARE_PROVIDER_SITE_OTHER): Payer: Medicare HMO

## 2019-03-09 ENCOUNTER — Other Ambulatory Visit: Payer: Self-pay

## 2019-03-09 DIAGNOSIS — Z23 Encounter for immunization: Secondary | ICD-10-CM | POA: Diagnosis not present

## 2019-06-13 ENCOUNTER — Encounter: Payer: Self-pay | Admitting: Internal Medicine

## 2019-06-14 MED ORDER — ATORVASTATIN CALCIUM 10 MG PO TABS: 10.0000 mg | ORAL_TABLET | Freq: Every day | ORAL | 1 refills | Status: DC

## 2019-06-14 MED ORDER — LEVOTHYROXINE SODIUM 75 MCG PO TABS: ORAL_TABLET | ORAL | 1 refills | Status: DC

## 2019-07-15 ENCOUNTER — Encounter: Payer: Self-pay | Admitting: Internal Medicine

## 2019-08-15 ENCOUNTER — Encounter: Payer: Self-pay | Admitting: Internal Medicine

## 2019-11-27 DIAGNOSIS — Z1231 Encounter for screening mammogram for malignant neoplasm of breast: Secondary | ICD-10-CM | POA: Diagnosis not present

## 2019-11-27 LAB — HM MAMMOGRAPHY

## 2019-12-06 ENCOUNTER — Encounter: Payer: Self-pay | Admitting: Internal Medicine

## 2019-12-25 ENCOUNTER — Ambulatory Visit (INDEPENDENT_AMBULATORY_CARE_PROVIDER_SITE_OTHER): Payer: Medicare HMO | Admitting: Internal Medicine

## 2019-12-25 ENCOUNTER — Other Ambulatory Visit: Payer: Self-pay | Admitting: Internal Medicine

## 2019-12-25 ENCOUNTER — Other Ambulatory Visit: Payer: Self-pay

## 2019-12-25 ENCOUNTER — Encounter: Payer: Self-pay | Admitting: Internal Medicine

## 2019-12-25 VITALS — BP 110/70 | HR 58 | Temp 98.2°F | Ht 60.5 in | Wt 97.3 lb

## 2019-12-25 DIAGNOSIS — E785 Hyperlipidemia, unspecified: Secondary | ICD-10-CM | POA: Diagnosis not present

## 2019-12-25 DIAGNOSIS — K52831 Collagenous colitis: Secondary | ICD-10-CM

## 2019-12-25 DIAGNOSIS — E538 Deficiency of other specified B group vitamins: Secondary | ICD-10-CM

## 2019-12-25 DIAGNOSIS — Z0000 Encounter for general adult medical examination without abnormal findings: Secondary | ICD-10-CM | POA: Diagnosis not present

## 2019-12-25 DIAGNOSIS — E039 Hypothyroidism, unspecified: Secondary | ICD-10-CM | POA: Diagnosis not present

## 2019-12-25 MED ORDER — ATORVASTATIN CALCIUM 10 MG PO TABS: 10.0000 mg | ORAL_TABLET | Freq: Every day | ORAL | 1 refills | Status: DC

## 2019-12-25 MED ORDER — CYANOCOBALAMIN 1000 MCG/ML IJ SOLN: INTRAMUSCULAR | 5 refills | Status: DC

## 2019-12-25 MED ORDER — LEVOTHYROXINE SODIUM 75 MCG PO TABS: ORAL_TABLET | ORAL | 1 refills | Status: DC

## 2019-12-25 MED ORDER — TB SYRINGE 1 ML MISC: 0 refills | Status: DC

## 2019-12-25 NOTE — Patient Instructions (Signed)
-Nice seeing you today!!  -Lab work today; will notify you once results are available.  -Consider your shingles vaccines at your pharmacy.  -See you back in 1 year or sooner as needed.   Preventive Care 17 Years and Older, Female Preventive care refers to lifestyle choices and visits with your health care provider that can promote health and wellness. This includes:  A yearly physical exam. This is also called an annual well check.  Regular dental and eye exams.  Immunizations.  Screening for certain conditions.  Healthy lifestyle choices, such as diet and exercise. What can I expect for my preventive care visit? Physical exam Your health care provider will check:  Height and weight. These may be used to calculate body mass index (BMI), which is a measurement that tells if you are at a healthy weight.  Heart rate and blood pressure.  Your skin for abnormal spots. Counseling Your health care provider may ask you questions about:  Alcohol, tobacco, and drug use.  Emotional well-being.  Home and relationship well-being.  Sexual activity.  Eating habits.  History of falls.  Memory and ability to understand (cognition).  Work and work Statistician.  Pregnancy and menstrual history. What immunizations do I need?  Influenza (flu) vaccine  This is recommended every year. Tetanus, diphtheria, and pertussis (Tdap) vaccine  You may need a Td booster every 10 years. Varicella (chickenpox) vaccine  You may need this vaccine if you have not already been vaccinated. Zoster (shingles) vaccine  You may need this after age 22. Pneumococcal conjugate (PCV13) vaccine  One dose is recommended after age 74. Pneumococcal polysaccharide (PPSV23) vaccine  One dose is recommended after age 10. Measles, mumps, and rubella (MMR) vaccine  You may need at least one dose of MMR if you were born in 1957 or later. You may also need a second dose. Meningococcal conjugate  (MenACWY) vaccine  You may need this if you have certain conditions. Hepatitis A vaccine  You may need this if you have certain conditions or if you travel or work in places where you may be exposed to hepatitis A. Hepatitis B vaccine  You may need this if you have certain conditions or if you travel or work in places where you may be exposed to hepatitis B. Haemophilus influenzae type b (Hib) vaccine  You may need this if you have certain conditions. You may receive vaccines as individual doses or as more than one vaccine together in one shot (combination vaccines). Talk with your health care provider about the risks and benefits of combination vaccines. What tests do I need? Blood tests  Lipid and cholesterol levels. These may be checked every 5 years, or more frequently depending on your overall health.  Hepatitis C test.  Hepatitis B test. Screening  Lung cancer screening. You may have this screening every year starting at age 24 if you have a 30-pack-year history of smoking and currently smoke or have quit within the past 15 years.  Colorectal cancer screening. All adults should have this screening starting at age 65 and continuing until age 36. Your health care provider may recommend screening at age 74 if you are at increased risk. You will have tests every 1-10 years, depending on your results and the type of screening test.  Diabetes screening. This is done by checking your blood sugar (glucose) after you have not eaten for a while (fasting). You may have this done every 1-3 years.  Mammogram. This may be done every 1-2  years. Talk with your health care provider about how often you should have regular mammograms.  BRCA-related cancer screening. This may be done if you have a family history of breast, ovarian, tubal, or peritoneal cancers. Other tests  Sexually transmitted disease (STD) testing.  Bone density scan. This is done to screen for osteoporosis. You may have this  done starting at age 92. Follow these instructions at home: Eating and drinking  Eat a diet that includes fresh fruits and vegetables, whole grains, lean protein, and low-fat dairy products. Limit your intake of foods with high amounts of sugar, saturated fats, and salt.  Take vitamin and mineral supplements as recommended by your health care provider.  Do not drink alcohol if your health care provider tells you not to drink.  If you drink alcohol: ? Limit how much you have to 0-1 drink a day. ? Be aware of how much alcohol is in your drink. In the [place] one drink equals one 12 oz bottle of beer (355 mL), one 5 oz glass of wine (148 mL), or one 1 oz glass of hard liquor (44 mL). Lifestyle  Take daily care of your teeth and gums.  Stay active. Exercise for at least 30 minutes on 5 or more days each week.  Do not use any products that contain nicotine or tobacco, such as cigarettes, e-cigarettes, and chewing tobacco. If you need help quitting, ask your health care provider.  If you are sexually active, practice safe sex. Use a condom or other form of protection in order to prevent STIs (sexually transmitted infections).  Talk with your health care provider about taking a low-dose aspirin or statin. What's next?  Go to your health care provider once a year for a well check visit.  Ask your health care provider how often you should have your eyes and teeth checked.  Stay up to date on all vaccines. This information is not intended to replace advice given to you by your health care provider. Make sure you discuss any questions you have with your health care provider. Document Revised: 05/10/2018 Document Reviewed: 05/10/2018 Elsevier Patient Education  2020 [employer].

## 2019-12-25 NOTE — Progress Notes (Signed)
Established Patient Office Visit     This visit occurred during the SARS-CoV-2 public health emergency.  Safety protocols were in place, including screening questions prior to the visit, additional usage of staff PPE, and extensive cleaning of exam room while observing appropriate contact time as indicated for disinfecting solutions.    CC/Reason for Visit: Annual preventive exam and subsequent Medicare wellness visit  HPI: Diane Becker is a 79 y.o. female who is coming in today for the above mentioned reasons. Past Medical History is significant for:   1. HLD on atorvastatin 10 mg in Feb, doing well without side effects.  2. Hypothyroidism on synthroid, without recent dosing changes.  3. Collagenous colitis and colon polyps, followed by Dr. Collene Mares. Currently asymptomatic and not on any medications.  4.  Vitamin B12 deficiency on monthly IM supplementation.  She has no acute complaints today.  She has received both of her Covid vaccines since I last saw her.  She is due for shingles, Pneumovax and Prevnar.  She had a colonoscopy in 2018 and is a 5-year callback due to family history of cancer, she had a normal mammogram in June.  She has elected to no longer continue Pap smears.   Past Medical/Surgical History: Past Medical History:  Diagnosis Date   Colitis, collagenous    Followed by Dr. Collene Mares at [facility] in GI   Colon polyps    Osteopenia    on > 5 years of bisphosphonate in the past   Rosacea    sees dermatologist Dr. Ubaldo Glassing for this   Thyroid disease     Past Surgical History:  Procedure Laterality Date   ABDOMINAL HYSTERECTOMY  1984   ABDOMINAL HYSTERECTOMY     for menorrhagia, no cervix per pt   THYROID SURGERY  2009   Dr. Margot Chimes     Social History:  reports that she has never smoked. She has never used smokeless tobacco. She reports that she does not drink alcohol and does not use drugs.  Allergies: Allergies  Allergen Reactions    Penicillins Hives   Amoxicillin Rash   Other Rash    "Cillins"    Family History:  Family History  Problem Relation Age of Onset   Colon cancer Unknown      Current Outpatient Medications:    Alcohol Swabs PADS, Use for B12 injections, Disp: 100 each, Rfl: 3   atorvastatin (LIPITOR) 10 MG tablet, Take 1 tablet (10 mg total) by mouth daily., Disp: 90 tablet, Rfl: 1   Azelaic Acid (FINACEA) 15 % cream, Apply topically as needed. After skin is thoroughly washed and patted dry, gently but thoroughly massage a thin film of azelaic acid cream into the affected area twice daily, in the morning and evening., Disp: , Rfl:    BIOTIN 5000 PO, Take by mouth daily., Disp: , Rfl:    Cholecalciferol (VITAMIN D3 PO), Take 1,000 Units by mouth daily., Disp: , Rfl:    cyanocobalamin (,VITAMIN B-12,) 1000 MCG/ML injection, Inject 56m in deltoid once weekly for 4 weeks, then inject 1 ml once a month thereafter, Disp: 30 mL, Rfl: 5   doxycycline (VIBRA-TABS) 100 MG tablet, Take 100 mg by mouth 2 (two) times daily., Disp: , Rfl:    levothyroxine (SYNTHROID) 75 MCG tablet, Take 1 tablet daily before breakfast, Disp: 90 tablet, Rfl: 1   Probiotic Product (PROBIOTIC DAILY PO), Take by mouth daily., Disp: , Rfl:    SYRINGE-NEEDLE, DISP, 3 ML (BD SAFETYGLIDE SYRINGE/NEEDLE)  25G X 1" 3 ML MISC, Inject 38m in deltoid once weekly for 4 weeks, then inject 1 ml once a month thereafter, Disp: 100 each, Rfl: 11  Review of Systems:  Constitutional: Denies fever, chills, diaphoresis, appetite change and fatigue.  HEENT: Denies photophobia, eye pain, redness, hearing loss, ear pain, congestion, sore throat, rhinorrhea, sneezing, mouth sores, trouble swallowing, neck pain, neck stiffness and tinnitus.   Respiratory: Denies SOB, DOE, cough, chest tightness,  and wheezing.   Cardiovascular: Denies chest pain, palpitations and leg swelling.  Gastrointestinal: Denies nausea, vomiting, abdominal pain, diarrhea,  constipation, blood in stool and abdominal distention.  Genitourinary: Denies dysuria, urgency, frequency, hematuria, flank pain and difficulty urinating.  Endocrine: Denies: hot or cold intolerance, sweats, changes in hair or nails, polyuria, polydipsia. Musculoskeletal: Denies myalgias, back pain, joint swelling, arthralgias and gait problem.  Skin: Denies pallor, rash and wound.  Neurological: Denies dizziness, seizures, syncope, weakness, light-headedness, numbness and headaches.  Hematological: Denies adenopathy. Easy bruising, personal or family bleeding history  Psychiatric/Behavioral: Denies suicidal ideation, mood changes, confusion, nervousness, sleep disturbance and agitation    Physical Exam: Vitals:   12/25/19 0700  BP: 110/70  Pulse: 58  Temp: 98.2 F (36.8 C)  TempSrc: Oral  SpO2: 98%  Weight: 97 lb 4.8 oz (44.1 kg)  Height: 5' 0.5" (1.537 m)    Body mass index is 18.69 kg/m.   Constitutional: NAD, calm, comfortable Eyes: PERRL, lids and conjunctivae normal, wears corrective lenses ENMT: Mucous membranes are moist. Tympanic membrane is pearly white, no erythema or bulging. Neck: normal, supple, no masses, no thyromegaly Respiratory: clear to auscultation bilaterally, no wheezing, no crackles. Normal respiratory effort. No accessory muscle use.  Cardiovascular: Regular rate and rhythm, no murmurs / rubs / gallops. No extremity edema. 2+ pedal pulses.  Abdomen: no tenderness, no masses palpated. No hepatosplenomegaly. Bowel sounds positive.  Musculoskeletal: no clubbing / cyanosis. No joint deformity upper and lower extremities. Good ROM, no contractures. Normal muscle tone.  Skin: no rashes, lesions, ulcers. No induration Neurologic: CN 2-12 grossly intact. Sensation intact, DTR normal. Strength 5/5 in all 4.  Psychiatric: Normal judgment and insight. Alert and oriented x 3. Normal mood.    Subsequent Medicare wellness visit   1. Risk factors, based on past   M,S,F -cardiovascular disease risk factors include age, history of hyperlipidemia   2.  Physical activities: She walks and does yoga every day   3.  Depression/mood:  Stable, not depressed   4.  Hearing:  No perceived issues   5.  ADL's: Independent in all ADLs   6.  Fall risk:  Low fall risk   7.  Home safety: No problems identified   8.  Height weight, and visual acuity: Height and weight as above, visual acuity is 20/25 on the right, 20/32 on the left, 20/25 with eyes together   9.  Counseling:  Counseled on importance of remaining vaccines including shingles and pneumonia   10. Lab orders based on risk factors: Laboratory update will be reviewed   11. Referral :  None today   12. Care plan:  Follow-up with me in 1 year   13. Cognitive assessment:  No cognitive impairment   14. Screening: Patient provided with a written and personalized 5-10 year screening schedule in the AVS.   yes   15. Provider List Update:   PCP only  16. Advance Directives: Full code     [facility] [facility] PH[facility]  Total Score 1      Fall Risk  12/25/2019 12/07/2017 11/29/2016 11/29/2016 11/26/2015  Falls in the past year? 0 No No No No  Number falls in past yr: 0 - - - -  Injury with Fall? 0 - - - -     Impression and Plan:  Encounter for preventive health examination  -She has routine eye and dental care. -She is due for shingles, Pneumovax, Prevnar she declines all 3 today. -Screening labs today. -Healthy lifestyle discussed in detail. -She declines further Pap smears. -She had a mammogram in June 2021. -She had a colonoscopy in 2018 and she plans to continue them every 5 years due to her history of colitis and family history of colon cancer. -She had a DEXA scan in 2020, repeat next year.  Vitamin B12 deficiency  - Plan: Vitamin B12 -She is on monthly IM supplementation.  Hyperlipidemia, unspecified hyperlipidemia type  - Plan: Lipid  panel -She takes atorvastatin 10 mg daily, last LDL was 141 in July 2020.  Hypothyroidism, unspecified type  - Plan: TSH -Continue current levothyroxine dose.  Colitis, collagenous  - followed by Dr. Collene Mares at [facility]  -No recent flareups.   Patient Instructions  -Nice seeing you today!!  -Lab work today; will notify you once results are available.  -Consider your shingles vaccines at your pharmacy.  -See you back in 1 year or sooner as needed.   Preventive Care 48 Years and Older, Female Preventive care refers to lifestyle choices and visits with your health care provider that can promote health and wellness. This includes:  A yearly physical exam. This is also called an annual well check.  Regular dental and eye exams.  Immunizations.  Screening for certain conditions.  Healthy lifestyle choices, such as diet and exercise. What can I expect for my preventive care visit? Physical exam Your health care provider will check:  Height and weight. These may be used to calculate body mass index (BMI), which is a measurement that tells if you are at a healthy weight.  Heart rate and blood pressure.  Your skin for abnormal spots. Counseling Your health care provider may ask you questions about:  Alcohol, tobacco, and drug use.  Emotional well-being.  Home and relationship well-being.  Sexual activity.  Eating habits.  History of falls.  Memory and ability to understand (cognition).  Work and work Statistician.  Pregnancy and menstrual history. What immunizations do I need?  Influenza (flu) vaccine  This is recommended every year. Tetanus, diphtheria, and pertussis (Tdap) vaccine  You may need a Td booster every 10 years. Varicella (chickenpox) vaccine  You may need this vaccine if you have not already been vaccinated. Zoster (shingles) vaccine  You may need this after age 28. Pneumococcal conjugate (PCV13) vaccine  One dose is recommended  after age 85. Pneumococcal polysaccharide (PPSV23) vaccine  One dose is recommended after age 15. Measles, mumps, and rubella (MMR) vaccine  You may need at least one dose of MMR if you were born in 1957 or later. You may also need a second dose. Meningococcal conjugate (MenACWY) vaccine  You may need this if you have certain conditions. Hepatitis A vaccine  You may need this if you have certain conditions or if you travel or work in places where you may be exposed to hepatitis A. Hepatitis B vaccine  You may need this if you have certain conditions or if you travel or work in places where you may be exposed to hepatitis  B. Haemophilus influenzae type b (Hib) vaccine  You may need this if you have certain conditions. You may receive vaccines as individual doses or as more than one vaccine together in one shot (combination vaccines). Talk with your health care provider about the risks and benefits of combination vaccines. What tests do I need? Blood tests  Lipid and cholesterol levels. These may be checked every 5 years, or more frequently depending on your overall health.  Hepatitis C test.  Hepatitis B test. Screening  Lung cancer screening. You may have this screening every year starting at age 63 if you have a 30-pack-year history of smoking and currently smoke or have quit within the past 15 years.  Colorectal cancer screening. All adults should have this screening starting at age 8 and continuing until age 44. Your health care provider may recommend screening at age 17 if you are at increased risk. You will have tests every 1-10 years, depending on your results and the type of screening test.  Diabetes screening. This is done by checking your blood sugar (glucose) after you have not eaten for a while (fasting). You may have this done every 1-3 years.  Mammogram. This may be done every 1-2 years. Talk with your health care provider about how often you should have regular  mammograms.  BRCA-related cancer screening. This may be done if you have a family history of breast, ovarian, tubal, or peritoneal cancers. Other tests  Sexually transmitted disease (STD) testing.  Bone density scan. This is done to screen for osteoporosis. You may have this done starting at age 28. Follow these instructions at home: Eating and drinking  Eat a diet that includes fresh fruits and vegetables, whole grains, lean protein, and low-fat dairy products. Limit your intake of foods with high amounts of sugar, saturated fats, and salt.  Take vitamin and mineral supplements as recommended by your health care provider.  Do not drink alcohol if your health care provider tells you not to drink.  If you drink alcohol: ? Limit how much you have to 0-1 drink a day. ? Be aware of how much alcohol is in your drink. In the [place] one drink equals one 12 oz bottle of beer (355 mL), one 5 oz glass of wine (148 mL), or one 1 oz glass of hard liquor (44 mL). Lifestyle  Take daily care of your teeth and gums.  Stay active. Exercise for at least 30 minutes on 5 or more days each week.  Do not use any products that contain nicotine or tobacco, such as cigarettes, e-cigarettes, and chewing tobacco. If you need help quitting, ask your health care provider.  If you are sexually active, practice safe sex. Use a condom or other form of protection in order to prevent STIs (sexually transmitted infections).  Talk with your health care provider about taking a low-dose aspirin or statin. What's next?  Go to your health care provider once a year for a well check visit.  Ask your health care provider how often you should have your eyes and teeth checked.  Stay up to date on all vaccines. This information is not intended to replace advice given to you by your health care provider. Make sure you discuss any questions you have with your health care provider. Document Revised: 05/10/2018 Document  Reviewed: 05/10/2018 Elsevier Patient Education  2020 [place], MD [facility]

## 2019-12-25 NOTE — Addendum Note (Signed)
Addended by: Westley Hummer B on: 12/25/2019 08:08 AM   Modules accepted: Orders

## 2019-12-26 ENCOUNTER — Encounter: Payer: Self-pay | Admitting: Internal Medicine

## 2019-12-26 ENCOUNTER — Other Ambulatory Visit: Payer: Self-pay | Admitting: Internal Medicine

## 2019-12-26 DIAGNOSIS — E039 Hypothyroidism, unspecified: Secondary | ICD-10-CM

## 2019-12-26 LAB — CBC WITH DIFFERENTIAL/PLATELET
Absolute Monocytes: 378 cells/uL (ref 200–950)
Basophils Absolute: 68 cells/uL (ref 0–200)
Basophils Relative: 1.5 %
Eosinophils Absolute: 171 cells/uL (ref 15–500)
Eosinophils Relative: 3.8 %
HCT: 41.2 % (ref 35.0–45.0)
Hemoglobin: 13.5 g/dL (ref 11.7–15.5)
Lymphs Abs: 1638 cells/uL (ref 850–3900)
MCH: 30.8 pg (ref 27.0–33.0)
MCHC: 32.8 g/dL (ref 32.0–36.0)
MCV: 94.1 fL (ref 80.0–100.0)
MPV: 9.7 fL (ref 7.5–12.5)
Monocytes Relative: 8.4 %
Neutro Abs: 2246 cells/uL (ref 1500–7800)
Neutrophils Relative %: 49.9 %
Platelets: 282 10*3/uL (ref 140–400)
RBC: 4.38 10*6/uL (ref 3.80–5.10)
RDW: 12.3 % (ref 11.0–15.0)
Total Lymphocyte: 36.4 %
WBC: 4.5 10*3/uL (ref 3.8–10.8)

## 2019-12-26 LAB — HEMOGLOBIN A1C
Hgb A1c MFr Bld: 5.5 % of total Hgb (ref <5.7)
Mean Plasma Glucose: 111 (calc)
eAG (mmol/L): 6.2 (calc)

## 2019-12-26 LAB — COMPREHENSIVE METABOLIC PANEL
AG Ratio: 1.9 (calc) (ref 1.0–2.5)
ALT: 24 U/L (ref 6–29)
AST: 27 U/L (ref 10–35)
Albumin: 4.3 g/dL (ref 3.6–5.1)
Alkaline phosphatase (APISO): 58 U/L (ref 37–153)
BUN/Creatinine Ratio: 20 (calc) (ref 6–22)
BUN: 21 mg/dL (ref 7–25)
CO2: 28 mmol/L (ref 20–32)
Calcium: 9.2 mg/dL (ref 8.6–10.4)
Chloride: 105 mmol/L (ref 98–110)
Creat: 1.04 mg/dL — ABNORMAL HIGH (ref 0.60–0.93)
Globulin: 2.3 g/dL (calc) (ref 1.9–3.7)
Glucose, Bld: 86 mg/dL (ref 65–99)
Potassium: 4 mmol/L (ref 3.5–5.3)
Sodium: 140 mmol/L (ref 135–146)
Total Bilirubin: 0.8 mg/dL (ref 0.2–1.2)
Total Protein: 6.6 g/dL (ref 6.1–8.1)

## 2019-12-26 LAB — LIPID PANEL
Cholesterol: 236 mg/dL — ABNORMAL HIGH (ref <200)
HDL: 89 mg/dL (ref >=50)
LDL Cholesterol (Calc): 131 mg/dL (calc) — ABNORMAL HIGH
Non-HDL Cholesterol (Calc): 147 mg/dL (calc) — ABNORMAL HIGH (ref <130)
Total CHOL/HDL Ratio: 2.7 (calc) (ref <5.0)
Triglycerides: 65 mg/dL (ref <150)

## 2019-12-26 LAB — TSH: TSH: 0.25 mIU/L — ABNORMAL LOW (ref 0.40–4.50)

## 2019-12-26 LAB — VITAMIN B12: Vitamin B-12: 571 pg/mL (ref 200–1100)

## 2019-12-26 LAB — VITAMIN D 25 HYDROXY (VIT D DEFICIENCY, FRACTURES): Vit D, 25-Hydroxy: 50 ng/mL (ref 30–100)

## 2019-12-26 MED ORDER — LEVOTHYROXINE SODIUM 50 MCG PO TABS: 50.0000 ug | ORAL_TABLET | Freq: Every day | ORAL | 1 refills | Status: DC

## 2019-12-26 MED ORDER — ATORVASTATIN CALCIUM 10 MG PO TABS: 10.0000 mg | ORAL_TABLET | Freq: Every day | ORAL | 1 refills | Status: DC

## 2019-12-27 ENCOUNTER — Other Ambulatory Visit: Payer: Self-pay | Admitting: *Deleted

## 2019-12-27 NOTE — Telephone Encounter (Signed)
Rx sent 

## 2019-12-29 ENCOUNTER — Encounter: Payer: Self-pay | Admitting: Internal Medicine

## 2020-01-01 MED ORDER — TB SYRINGE 1 ML MISC: 0 refills | Status: DC

## 2020-01-01 MED ORDER — CYANOCOBALAMIN 1000 MCG/ML IJ SOLN: INTRAMUSCULAR | 5 refills | Status: DC

## 2020-01-01 NOTE — Telephone Encounter (Signed)
Spoke with patient and B12 and supplies sent to [facility]

## 2020-01-10 ENCOUNTER — Other Ambulatory Visit: Payer: Self-pay | Admitting: Internal Medicine

## 2020-01-10 DIAGNOSIS — E039 Hypothyroidism, unspecified: Secondary | ICD-10-CM

## 2020-01-10 NOTE — Progress Notes (Signed)
Per last lab results future ordered TSH per PCP/thx dmf

## 2020-02-07 ENCOUNTER — Other Ambulatory Visit: Payer: Self-pay

## 2020-02-10 ENCOUNTER — Other Ambulatory Visit: Payer: Self-pay

## 2020-02-10 ENCOUNTER — Other Ambulatory Visit (INDEPENDENT_AMBULATORY_CARE_PROVIDER_SITE_OTHER): Payer: Medicare HMO

## 2020-02-10 DIAGNOSIS — E039 Hypothyroidism, unspecified: Secondary | ICD-10-CM

## 2020-02-11 LAB — TSH: TSH: 3.48 mIU/L (ref 0.40–4.50)

## 2020-03-02 ENCOUNTER — Encounter: Payer: Self-pay | Admitting: Internal Medicine

## 2020-06-04 DIAGNOSIS — H04123 Dry eye syndrome of bilateral lacrimal glands: Secondary | ICD-10-CM | POA: Diagnosis not present

## 2020-06-04 DIAGNOSIS — H52203 Unspecified astigmatism, bilateral: Secondary | ICD-10-CM | POA: Diagnosis not present

## 2020-06-04 DIAGNOSIS — H524 Presbyopia: Secondary | ICD-10-CM | POA: Diagnosis not present

## 2020-06-04 DIAGNOSIS — H2513 Age-related nuclear cataract, bilateral: Secondary | ICD-10-CM | POA: Diagnosis not present

## 2020-06-04 DIAGNOSIS — H5213 Myopia, bilateral: Secondary | ICD-10-CM | POA: Diagnosis not present

## 2020-08-29 ENCOUNTER — Other Ambulatory Visit: Payer: Self-pay | Admitting: Internal Medicine

## 2020-11-06 ENCOUNTER — Encounter: Payer: Self-pay | Admitting: Internal Medicine

## 2020-12-28 ENCOUNTER — Other Ambulatory Visit: Payer: Self-pay

## 2020-12-29 ENCOUNTER — Encounter: Payer: Self-pay | Admitting: Internal Medicine

## 2020-12-29 ENCOUNTER — Ambulatory Visit (INDEPENDENT_AMBULATORY_CARE_PROVIDER_SITE_OTHER): Payer: Medicare HMO | Admitting: Internal Medicine

## 2020-12-29 ENCOUNTER — Other Ambulatory Visit: Payer: Self-pay | Admitting: Internal Medicine

## 2020-12-29 VITALS — BP 130/80 | HR 81 | Temp 98.4°F | Ht 60.0 in | Wt 98.3 lb

## 2020-12-29 DIAGNOSIS — E538 Deficiency of other specified B group vitamins: Secondary | ICD-10-CM

## 2020-12-29 DIAGNOSIS — E785 Hyperlipidemia, unspecified: Secondary | ICD-10-CM

## 2020-12-29 DIAGNOSIS — K52831 Collagenous colitis: Secondary | ICD-10-CM

## 2020-12-29 DIAGNOSIS — Z13820 Encounter for screening for osteoporosis: Secondary | ICD-10-CM | POA: Diagnosis not present

## 2020-12-29 DIAGNOSIS — Z0000 Encounter for general adult medical examination without abnormal findings: Secondary | ICD-10-CM | POA: Diagnosis not present

## 2020-12-29 DIAGNOSIS — Z780 Asymptomatic menopausal state: Secondary | ICD-10-CM

## 2020-12-29 DIAGNOSIS — E039 Hypothyroidism, unspecified: Secondary | ICD-10-CM | POA: Diagnosis not present

## 2020-12-29 LAB — COMPREHENSIVE METABOLIC PANEL
ALT: 19 U/L (ref 0–35)
AST: 23 U/L (ref 0–37)
Albumin: 4.3 g/dL (ref 3.5–5.2)
Alkaline Phosphatase: 59 U/L (ref 39–117)
BUN: 17 mg/dL (ref 6–23)
CO2: 28 mEq/L (ref 19–32)
Calcium: 9.4 mg/dL (ref 8.4–10.5)
Chloride: 103 mEq/L (ref 96–112)
Creatinine, Ser: 1.04 mg/dL (ref 0.40–1.20)
GFR: 51.7 mL/min — ABNORMAL LOW (ref >60.00)
Glucose, Bld: 87 mg/dL (ref 70–99)
Potassium: 4 mEq/L (ref 3.5–5.1)
Sodium: 140 mEq/L (ref 135–145)
Total Bilirubin: 0.7 mg/dL (ref 0.2–1.2)
Total Protein: 7 g/dL (ref 6.0–8.3)

## 2020-12-29 LAB — CBC WITH DIFFERENTIAL/PLATELET
Basophils Absolute: 0.1 10*3/uL (ref 0.0–0.1)
Basophils Relative: 1.8 % (ref 0.0–3.0)
Eosinophils Absolute: 0.2 10*3/uL (ref 0.0–0.7)
Eosinophils Relative: 4.9 % (ref 0.0–5.0)
HCT: 39.9 % (ref 36.0–46.0)
Hemoglobin: 13.5 g/dL (ref 12.0–15.0)
Lymphocytes Relative: 34.4 % (ref 12.0–46.0)
Lymphs Abs: 1.4 10*3/uL (ref 0.7–4.0)
MCHC: 33.9 g/dL (ref 30.0–36.0)
MCV: 92.8 fl (ref 78.0–100.0)
Monocytes Absolute: 0.4 10*3/uL (ref 0.1–1.0)
Monocytes Relative: 9 % (ref 3.0–12.0)
Neutro Abs: 2 10*3/uL (ref 1.4–7.7)
Neutrophils Relative %: 49.9 % (ref 43.0–77.0)
Platelets: 250 10*3/uL (ref 150.0–400.0)
RBC: 4.3 Mil/uL (ref 3.87–5.11)
RDW: 13.4 % (ref 11.5–15.5)
WBC: 4.1 10*3/uL (ref 4.0–10.5)

## 2020-12-29 LAB — LIPID PANEL
Cholesterol: 260 mg/dL — ABNORMAL HIGH (ref 0–200)
HDL: 97 mg/dL (ref >39.00)
LDL Cholesterol: 148 mg/dL — ABNORMAL HIGH (ref 0–99)
NonHDL: 163.38
Total CHOL/HDL Ratio: 3
Triglycerides: 79 mg/dL (ref 0.0–149.0)
VLDL: 15.8 mg/dL (ref 0.0–40.0)

## 2020-12-29 LAB — VITAMIN D 25 HYDROXY (VIT D DEFICIENCY, FRACTURES): VITD: 57.96 ng/mL (ref 30.00–100.00)

## 2020-12-29 LAB — VITAMIN B12: Vitamin B-12: >1550 pg/mL — ABNORMAL HIGH (ref 211–911)

## 2020-12-29 LAB — TSH: TSH: 12.47 u[IU]/mL — ABNORMAL HIGH (ref 0.35–5.50)

## 2020-12-29 LAB — HEMOGLOBIN A1C: Hgb A1c MFr Bld: 5.9 % (ref 4.6–6.5)

## 2020-12-29 MED ORDER — ATORVASTATIN CALCIUM 10 MG PO TABS: 10.0000 mg | ORAL_TABLET | Freq: Every day | ORAL | 3 refills | Status: DC

## 2020-12-29 MED ORDER — LEVOTHYROXINE SODIUM 75 MCG PO TABS: 75.0000 ug | ORAL_TABLET | Freq: Every day | ORAL | 1 refills | Status: DC

## 2020-12-29 MED ORDER — LEVOTHYROXINE SODIUM 50 MCG PO TABS: 50.0000 ug | ORAL_TABLET | Freq: Every day | ORAL | 3 refills | Status: DC

## 2020-12-29 MED ORDER — TB SYRINGE 1 ML MISC: 0 refills | Status: DC

## 2020-12-29 MED ORDER — CYANOCOBALAMIN 1000 MCG/ML IJ SOLN: INTRAMUSCULAR | 5 refills | Status: DC

## 2020-12-29 NOTE — Progress Notes (Signed)
Established Patient Office Visit     This visit occurred during the SARS-CoV-2 public health emergency.  Safety protocols were in place, including screening questions prior to the visit, additional usage of staff PPE, and extensive cleaning of exam room while observing appropriate contact time as indicated for disinfecting solutions.    CC/Reason for Visit: Annual preventive exam and follow-up chronic medical conditions  HPI: Diane Becker is a 80 y.o. female who is coming in today for the above mentioned reasons. Past Medical History is significant for: Hyperlipidemia, hypothyroidism, vitamin B12 deficiency and collagenous colitis followed by Dr. Collene Mares.  She is feeling well and has no acute concerns or complaints today.  She has routine eye or dental care.  No perceived hearing issues, she does yoga, gardening and walks about 2 miles every day.  She is overdue for pneumonia, shingles and her second COVID booster.  She does mammograms every other year colonoscopies every 5 years and she is now due for a DEXA scan.  She no longer does Pap smears.   Past Medical/Surgical History: Past Medical History:  Diagnosis Date   Colitis, collagenous    Followed by Dr. Collene Mares at [facility] in GI   Colon polyps    Osteopenia    on > 5 years of bisphosphonate in the past   Rosacea    sees dermatologist Dr. Ubaldo Glassing for this   Thyroid disease     Past Surgical History:  Procedure Laterality Date   ABDOMINAL HYSTERECTOMY  1984   ABDOMINAL HYSTERECTOMY     for menorrhagia, no cervix per pt   THYROID SURGERY  2009   Dr. Margot Chimes     Social History:  reports that she has never smoked. She has never used smokeless tobacco. She reports that she does not drink alcohol and does not use drugs.  Allergies: Allergies  Allergen Reactions   Penicillins Hives   Amoxicillin Rash   Other Rash    "Cillins"    Family History:  Family History  Problem Relation Age of Onset   Colon cancer Unknown       Current Outpatient Medications:    Alcohol Swabs PADS, Use for B12 injections, Disp: 100 each, Rfl: 3   Azelaic Acid 15 % gel, Apply topically as needed. After skin is thoroughly washed and patted dry, gently but thoroughly massage a thin film of azelaic acid cream into the affected area twice daily, in the morning and evening., Disp: , Rfl:    BIOTIN 5000 PO, Take by mouth daily., Disp: , Rfl:    Cholecalciferol (VITAMIN D3 PO), Take 1,000 Units by mouth daily., Disp: , Rfl:    doxycycline (VIBRA-TABS) 100 MG tablet, Take 100 mg by mouth 2 (two) times daily., Disp: , Rfl:    Probiotic Product (PROBIOTIC DAILY PO), Take by mouth daily., Disp: , Rfl:    atorvastatin (LIPITOR) 10 MG tablet, Take 1 tablet (10 mg total) by mouth daily., Disp: 90 tablet, Rfl: 3   cyanocobalamin (,VITAMIN B-12,) 1000 MCG/ML injection, inject 1 ml once a month, Disp: 30 mL, Rfl: 5   levothyroxine (SYNTHROID) 50 MCG tablet, Take 1 tablet (50 mcg total) by mouth daily before breakfast. Take 1 tablet daily before breakfast, Disp: 90 tablet, Rfl: 3   Needles & Syringes (1CC TB SYRINGE) MISC, Use for B12 injections, Disp: 15 each, Rfl: 0  Review of Systems:  Constitutional: Denies fever, chills, diaphoresis, appetite change and fatigue.  HEENT: Denies photophobia, eye pain, redness,  hearing loss, ear pain, congestion, sore throat, rhinorrhea, sneezing, mouth sores, trouble swallowing, neck pain, neck stiffness and tinnitus.   Respiratory: Denies SOB, DOE, cough, chest tightness,  and wheezing.   Cardiovascular: Denies chest pain, palpitations and leg swelling.  Gastrointestinal: Denies nausea, vomiting, abdominal pain, diarrhea, constipation, blood in stool and abdominal distention.  Genitourinary: Denies dysuria, urgency, frequency, hematuria, flank pain and difficulty urinating.  Endocrine: Denies: hot or cold intolerance, sweats, changes in hair or nails, polyuria, polydipsia. Musculoskeletal: Denies myalgias,  back pain, joint swelling, arthralgias and gait problem.  Skin: Denies pallor, rash and wound.  Neurological: Denies dizziness, seizures, syncope, weakness, light-headedness, numbness and headaches.  Hematological: Denies adenopathy. Easy bruising, personal or family bleeding history  Psychiatric/Behavioral: Denies suicidal ideation, mood changes, confusion, nervousness, sleep disturbance and agitation    Physical Exam: Vitals:   12/29/20 0732  BP: 130/80  Pulse: 81  Temp: 98.4 F (36.9 C)  TempSrc: Oral  SpO2: 95%  Weight: 98 lb 4.8 oz (44.6 kg)  Height: 5' (1.524 m)    Body mass index is 19.2 kg/m.   Constitutional: NAD, calm, comfortable Eyes: PERRL, lids and conjunctivae normal, wears corrective lenses ENMT: Mucous membranes are moist. Posterior pharynx clear of any exudate or lesions. Normal dentition. Tympanic membrane is pearly white, no erythema or bulging. Neck: normal, supple, no masses, no thyromegaly Respiratory: clear to auscultation bilaterally, no wheezing, no crackles. Normal respiratory effort. No accessory muscle use.  Cardiovascular: Regular rate and rhythm, no murmurs / rubs / gallops. No extremity edema. 2+ pedal pulses. No carotid bruits.  Abdomen: no tenderness, no masses palpated. No hepatosplenomegaly. Bowel sounds positive.  Musculoskeletal: no clubbing / cyanosis. No joint deformity upper and lower extremities. Good ROM, no contractures. Normal muscle tone.  Skin: no rashes, lesions, ulcers. No induration Neurologic: CN 2-12 grossly intact. Sensation intact, DTR normal. Strength 5/5 in all 4.  Psychiatric: Normal judgment and insight. Alert and oriented x 3. Normal mood.    Subsequent Medicare wellness visit   1. Risk factors, based on past  M,S,F -cardiovascular disease risk factors include age and hyperlipidemia   2.  Physical activities: Quite physically active with gardening, chair yoga and walking around 2 miles a day   3.  Depression/mood:  Stable, not depressed   4.  Hearing: No perceived issues   5.  ADL's: Independent in all ADLs   6.  Fall risk: Low fall risk   7.  Home safety: No problems identified   8.  Height weight, and visual acuity: height and weight as above, vision:  Vision Screening   Right eye Left eye Both eyes  Without correction     With correction '20/32 20/32 20/32 '$     9.  Counseling: Advised that she update her vaccination status   10. Lab orders based on risk factors: Laboratory update will be reviewed   11. Referral : None today   12. Care plan: Follow-up with me in 1 year   13. Cognitive assessment: No cognitive impairment   14. Screening: Patient provided with a written and personalized 5-10 year screening schedule in the AVS. yes   15. Provider List Update: PCP only  16. Advance Directives: Full code   17. Opioids: Patient is not on any opioid prescriptions and has no risk factors for a substance use disorder.   [place] Office Visit from 12/29/2020 in [place] at [facility]  PHQ-9 Total Score 0       Fall Risk  12/29/2020  12/25/2019 12/07/2017 11/29/2016 11/29/2016  Falls in the past year? 0 0 No No No  Number falls in past yr: 0 0 - - -  Injury with Fall? 0 0 - - -     Impression and Plan:  Encounter for preventive health examination -Advised routine eye and dental care. -Screening labs today. -Healthy lifestyle discussed in detail. -She is due for pneumonia vaccine which she declines, she is also due for second COVID booster and shingles vaccine which she has been counseled to obtain at pharmacy. -She does mammogram every other year, due in 2023. -last colonoscopy was in 2018 and she is a 5-year callback. -Repeat DEXA scan this year. -She no longer does Pap smears.  Hypothyroidism, unspecified type  -Check TSH today. -Currently on levothyroxine 50 mcg daily.  Hyperlipidemia, unspecified hyperlipidemia type  - Plan: CBC with Differential/Platelet,  Comprehensive metabolic panel, Lipid panel -Last lipid panel) 2021 with a total cholesterol 236, triglycerides 65 and LDL 131. -She is currently on atorvastatin 10 mg daily, recheck lipids today.  Colitis, collagenous  - followed by Dr. Collene Mares at [facility] -Will be due for repeat colonoscopy in 2023.  Vitamin B12 deficiency  - Plan: Vitamin B12  Encounter for osteoporosis screening in asymptomatic postmenopausal patient -  Plan: DG Bone Density    Patient Instructions  -Nice seeing you today!!  -Lab work today; will notify you once results are available.  -Remember your 4th COVID, shingles and pneumonia vaccines.  -Schedule follow up in 1 year or sooner as needed.     Lelon Frohlich, MD [facility] at [facility]

## 2020-12-29 NOTE — Patient Instructions (Signed)
-  Nice seeing you today!!  -Lab work today; will notify you once results are available.  -Remember your 4th COVID, shingles and pneumonia vaccines.  -Schedule follow up in 1 year or sooner as needed.

## 2020-12-30 ENCOUNTER — Encounter: Payer: Self-pay | Admitting: Internal Medicine

## 2020-12-30 ENCOUNTER — Other Ambulatory Visit: Payer: Self-pay | Admitting: Internal Medicine

## 2020-12-30 DIAGNOSIS — E039 Hypothyroidism, unspecified: Secondary | ICD-10-CM

## 2020-12-30 DIAGNOSIS — E785 Hyperlipidemia, unspecified: Secondary | ICD-10-CM

## 2020-12-30 MED ORDER — LEVOTHYROXINE SODIUM 75 MCG PO TABS: 75.0000 ug | ORAL_TABLET | Freq: Every day | ORAL | 3 refills | Status: DC

## 2020-12-30 MED ORDER — ATORVASTATIN CALCIUM 20 MG PO TABS: 20.0000 mg | ORAL_TABLET | Freq: Every day | ORAL | 3 refills | Status: DC

## 2021-03-16 DIAGNOSIS — L718 Other rosacea: Secondary | ICD-10-CM | POA: Diagnosis not present

## 2021-03-16 DIAGNOSIS — D1801 Hemangioma of skin and subcutaneous tissue: Secondary | ICD-10-CM | POA: Diagnosis not present

## 2021-03-16 DIAGNOSIS — L661 Lichen planopilaris: Secondary | ICD-10-CM | POA: Diagnosis not present

## 2021-03-16 DIAGNOSIS — L821 Other seborrheic keratosis: Secondary | ICD-10-CM | POA: Diagnosis not present

## 2021-04-01 ENCOUNTER — Encounter: Payer: Self-pay | Admitting: Internal Medicine

## 2021-04-05 ENCOUNTER — Other Ambulatory Visit (INDEPENDENT_AMBULATORY_CARE_PROVIDER_SITE_OTHER): Payer: Medicare HMO

## 2021-04-05 ENCOUNTER — Other Ambulatory Visit: Payer: Self-pay

## 2021-04-05 DIAGNOSIS — E039 Hypothyroidism, unspecified: Secondary | ICD-10-CM | POA: Diagnosis not present

## 2021-04-05 DIAGNOSIS — E785 Hyperlipidemia, unspecified: Secondary | ICD-10-CM

## 2021-04-05 LAB — LIPID PANEL
Cholesterol: 202 mg/dL — ABNORMAL HIGH (ref 0–200)
HDL: 72.9 mg/dL (ref >39.00)
LDL Cholesterol: 111 mg/dL — ABNORMAL HIGH (ref 0–99)
NonHDL: 129.53
Total CHOL/HDL Ratio: 3
Triglycerides: 95 mg/dL (ref 0.0–149.0)
VLDL: 19 mg/dL (ref 0.0–40.0)

## 2021-04-05 LAB — TSH: TSH: 1.19 u[IU]/mL (ref 0.35–5.50)

## 2021-05-11 DIAGNOSIS — Z1231 Encounter for screening mammogram for malignant neoplasm of breast: Secondary | ICD-10-CM | POA: Diagnosis not present

## 2021-05-11 DIAGNOSIS — M8589 Other specified disorders of bone density and structure, multiple sites: Secondary | ICD-10-CM | POA: Diagnosis not present

## 2021-05-11 LAB — HM MAMMOGRAPHY

## 2021-05-11 LAB — HM DEXA SCAN

## 2021-05-14 ENCOUNTER — Encounter: Payer: Self-pay | Admitting: Internal Medicine

## 2021-05-28 ENCOUNTER — Encounter: Payer: Self-pay | Admitting: Internal Medicine

## 2021-06-02 ENCOUNTER — Encounter: Payer: Self-pay | Admitting: Internal Medicine

## 2021-09-20 ENCOUNTER — Encounter: Payer: Self-pay | Admitting: Internal Medicine

## 2021-09-20 DIAGNOSIS — E039 Hypothyroidism, unspecified: Secondary | ICD-10-CM

## 2021-09-20 MED ORDER — LEVOTHYROXINE SODIUM 75 MCG PO TABS: 75.0000 ug | ORAL_TABLET | Freq: Every day | ORAL | 1 refills | Status: DC

## 2021-09-20 MED ORDER — ATORVASTATIN CALCIUM 20 MG PO TABS: 20.0000 mg | ORAL_TABLET | Freq: Every day | ORAL | 1 refills | Status: DC

## 2021-11-28 ENCOUNTER — Other Ambulatory Visit: Payer: Self-pay | Admitting: Internal Medicine

## 2021-11-28 DIAGNOSIS — E039 Hypothyroidism, unspecified: Secondary | ICD-10-CM

## 2021-12-30 ENCOUNTER — Other Ambulatory Visit: Payer: Self-pay | Admitting: Internal Medicine

## 2021-12-30 ENCOUNTER — Ambulatory Visit (INDEPENDENT_AMBULATORY_CARE_PROVIDER_SITE_OTHER): Payer: Medicare Other | Admitting: Internal Medicine

## 2021-12-30 ENCOUNTER — Encounter: Payer: Self-pay | Admitting: Internal Medicine

## 2021-12-30 VITALS — BP 130/60 | HR 66 | Temp 98.2°F | Ht 60.24 in | Wt 98.3 lb

## 2021-12-30 DIAGNOSIS — Z0000 Encounter for general adult medical examination without abnormal findings: Secondary | ICD-10-CM | POA: Diagnosis not present

## 2021-12-30 DIAGNOSIS — E782 Mixed hyperlipidemia: Secondary | ICD-10-CM

## 2021-12-30 DIAGNOSIS — M8580 Other specified disorders of bone density and structure, unspecified site: Secondary | ICD-10-CM | POA: Diagnosis not present

## 2021-12-30 DIAGNOSIS — K52831 Collagenous colitis: Secondary | ICD-10-CM

## 2021-12-30 DIAGNOSIS — E538 Deficiency of other specified B group vitamins: Secondary | ICD-10-CM | POA: Diagnosis not present

## 2021-12-30 DIAGNOSIS — E039 Hypothyroidism, unspecified: Secondary | ICD-10-CM

## 2021-12-30 LAB — CBC WITH DIFFERENTIAL/PLATELET
Basophils Absolute: 0.1 10*3/uL (ref 0.0–0.1)
Basophils Relative: 1.3 % (ref 0.0–3.0)
Eosinophils Absolute: 0.3 10*3/uL (ref 0.0–0.7)
Eosinophils Relative: 5.3 % — ABNORMAL HIGH (ref 0.0–5.0)
HCT: 42 % (ref 36.0–46.0)
Hemoglobin: 14.2 g/dL (ref 12.0–15.0)
Lymphocytes Relative: 28.6 % (ref 12.0–46.0)
Lymphs Abs: 1.5 10*3/uL (ref 0.7–4.0)
MCHC: 33.9 g/dL (ref 30.0–36.0)
MCV: 91.8 fl (ref 78.0–100.0)
Monocytes Absolute: 0.5 10*3/uL (ref 0.1–1.0)
Monocytes Relative: 10.6 % (ref 3.0–12.0)
Neutro Abs: 2.8 10*3/uL (ref 1.4–7.7)
Neutrophils Relative %: 54.2 % (ref 43.0–77.0)
Platelets: 299 10*3/uL (ref 150.0–400.0)
RBC: 4.58 Mil/uL (ref 3.87–5.11)
RDW: 13.5 % (ref 11.5–15.5)
WBC: 5.1 10*3/uL (ref 4.0–10.5)

## 2021-12-30 LAB — LIPID PANEL
Cholesterol: 233 mg/dL — ABNORMAL HIGH (ref 0–200)
HDL: 82.6 mg/dL (ref >39.00)
LDL Cholesterol: 137 mg/dL — ABNORMAL HIGH (ref 0–99)
NonHDL: 150.04
Total CHOL/HDL Ratio: 3
Triglycerides: 67 mg/dL (ref 0.0–149.0)
VLDL: 13.4 mg/dL (ref 0.0–40.0)

## 2021-12-30 LAB — VITAMIN B12: Vitamin B-12: >1500 pg/mL — ABNORMAL HIGH (ref 211–911)

## 2021-12-30 LAB — COMPREHENSIVE METABOLIC PANEL
ALT: 19 U/L (ref 0–35)
AST: 23 U/L (ref 0–37)
Albumin: 4.5 g/dL (ref 3.5–5.2)
Alkaline Phosphatase: 64 U/L (ref 39–117)
BUN: 18 mg/dL (ref 6–23)
CO2: 27 mEq/L (ref 19–32)
Calcium: 9.5 mg/dL (ref 8.4–10.5)
Chloride: 103 mEq/L (ref 96–112)
Creatinine, Ser: 1.04 mg/dL (ref 0.40–1.20)
GFR: 51.34 mL/min — ABNORMAL LOW (ref >60.00)
Glucose, Bld: 85 mg/dL (ref 70–99)
Potassium: 4.2 mEq/L (ref 3.5–5.1)
Sodium: 139 mEq/L (ref 135–145)
Total Bilirubin: 0.7 mg/dL (ref 0.2–1.2)
Total Protein: 7.6 g/dL (ref 6.0–8.3)

## 2021-12-30 LAB — VITAMIN D 25 HYDROXY (VIT D DEFICIENCY, FRACTURES): VITD: 56.58 ng/mL (ref 30.00–100.00)

## 2021-12-30 LAB — HEMOGLOBIN A1C: Hgb A1c MFr Bld: 5.9 % (ref 4.6–6.5)

## 2021-12-30 LAB — TSH: TSH: 0.55 u[IU]/mL (ref 0.35–5.50)

## 2021-12-30 MED ORDER — ATORVASTATIN CALCIUM 40 MG PO TABS: 40.0000 mg | ORAL_TABLET | Freq: Every day | ORAL | 1 refills | Status: DC

## 2021-12-30 NOTE — Patient Instructions (Signed)
-  Nice seeing you today!!  -Lab work today; will notify you once results are available.  -Consider your COVID, pneumonia and shingles vaccines at the pharmacy.  -See you back in 6 months.

## 2021-12-30 NOTE — Progress Notes (Signed)
Established Patient Office Visit     CC/Reason for Visit: Annual preventive exam and subsequent Medicare wellness visit  HPI: Diane Becker is a 81 y.o. female who is coming in today for the above mentioned reasons. Past Medical History is significant for: Hyperlipidemia, hypothyroidism, vitamin B12 deficiency and a history of collagenous colitis followed by Dr. Collene Mares.  She has routine eye and dental care.  No perceived hearing issues.  She is overdue for COVID, flu, pneumonia, shingles vaccines.  She has no acute concerns or complaints today.   Past Medical/Surgical History: Past Medical History:  Diagnosis Date   Colitis, collagenous    Followed by Dr. Collene Mares at [facility] in GI   Colon polyps    Osteopenia    on > 5 years of bisphosphonate in the past   Rosacea    sees dermatologist Dr. Ubaldo Glassing for this   Thyroid disease     Past Surgical History:  Procedure Laterality Date   ABDOMINAL HYSTERECTOMY  1984   ABDOMINAL HYSTERECTOMY     for menorrhagia, no cervix per pt   THYROID SURGERY  2009   Dr. Margot Chimes     Social History:  reports that she has never smoked. She has never used smokeless tobacco. She reports that she does not drink alcohol and does not use drugs.  Allergies: Allergies  Allergen Reactions   Penicillins Hives   Amoxicillin Rash   Other Rash    "Cillins"    Family History:  Family History  Problem Relation Age of Onset   Colon cancer Unknown      Current Outpatient Medications:    Alcohol Swabs PADS, Use for B12 injections, Disp: 100 each, Rfl: 3   atorvastatin (LIPITOR) 20 MG tablet, TAKE 1 TABLET BY MOUTH DAILY, Disp: 90 tablet, Rfl: 1   Azelaic Acid 15 % gel, Apply topically as needed. After skin is thoroughly washed and patted dry, gently but thoroughly massage a thin film of azelaic acid cream into the affected area twice daily, in the morning and evening., Disp: , Rfl:    BIOTIN 5000 PO, Take by mouth daily., Disp: , Rfl:     Cholecalciferol (VITAMIN D3 PO), Take 1,000 Units by mouth daily., Disp: , Rfl:    cyanocobalamin (,VITAMIN B-12,) 1000 MCG/ML injection, inject 1 ml once a month, Disp: 30 mL, Rfl: 5   doxycycline (VIBRA-TABS) 100 MG tablet, Take 100 mg by mouth 2 (two) times daily., Disp: , Rfl:    levothyroxine (SYNTHROID) 75 MCG tablet, TAKE 1 TABLET BY MOUTH DAILY  BEFORE BREAKFAST, Disp: 90 tablet, Rfl: 1   Methylcobalamin (B12) 5000 MCG SUBL, Place under the tongue., Disp: , Rfl:    Needles & Syringes (1CC TB SYRINGE) MISC, Use for B12 injections, Disp: 15 each, Rfl: 0   Probiotic Product (PROBIOTIC DAILY PO), Take by mouth daily., Disp: , Rfl:   Review of Systems:  Constitutional: Denies fever, chills, diaphoresis, appetite change and fatigue.  HEENT: Denies photophobia, eye pain, redness, hearing loss, ear pain, congestion, sore throat, rhinorrhea, sneezing, mouth sores, trouble swallowing, neck pain, neck stiffness and tinnitus.   Respiratory: Denies SOB, DOE, cough, chest tightness,  and wheezing.   Cardiovascular: Denies chest pain, palpitations and leg swelling.  Gastrointestinal: Denies nausea, vomiting, abdominal pain, diarrhea, constipation, blood in stool and abdominal distention.  Genitourinary: Denies dysuria, urgency, frequency, hematuria, flank pain and difficulty urinating.  Endocrine: Denies: hot or cold intolerance, sweats, changes in hair or nails, polyuria, polydipsia.  Musculoskeletal: Denies myalgias, back pain, joint swelling, arthralgias and gait problem.  Skin: Denies pallor, rash and wound.  Neurological: Denies dizziness, seizures, syncope, weakness, light-headedness, numbness and headaches.  Hematological: Denies adenopathy. Easy bruising, personal or family bleeding history  Psychiatric/Behavioral: Denies suicidal ideation, mood changes, confusion, nervousness, sleep disturbance and agitation    Physical Exam: Vitals:   12/30/21 0703 12/30/21 0731  BP: (!) 140/60 130/60   Pulse: 66   Temp: 98.2 F (36.8 C)   TempSrc: Oral   SpO2: 98%   Weight: 98 lb 4.8 oz (44.6 kg)   Height: 5' 0.24" (1.53 m)     Body mass index is 19.05 kg/m.   Constitutional: NAD, calm, comfortable Eyes: PERRL, lids and conjunctivae normal, wears corrective lenses ENMT: Mucous membranes are moist. Posterior pharynx clear of any exudate or lesions. Normal dentition. Tympanic membrane is pearly white, no erythema or bulging. Neck: normal, supple, no masses, no thyromegaly Respiratory: clear to auscultation bilaterally, no wheezing, no crackles. Normal respiratory effort. No accessory muscle use.  Cardiovascular: Regular rate and rhythm, no murmurs / rubs / gallops. No extremity edema. 2+ pedal pulses. No carotid bruits.  Abdomen: no tenderness, no masses palpated. No hepatosplenomegaly. Bowel sounds positive.  Musculoskeletal: no clubbing / cyanosis. No joint deformity upper and lower extremities. Good ROM, no contractures. Normal muscle tone.  Skin: no rashes, lesions, ulcers. No induration Neurologic: CN 2-12 grossly intact. Sensation intact, DTR normal. Strength 5/5 in all 4.  Psychiatric: Normal judgment and insight. Alert and oriented x 3. Normal mood.    Subsequent Medicare wellness visit   1. Risk factors, based on past  M,S,F -cardiovascular disease risk factors include age and history of hyperlipidemia   2.  Physical activities: She walks and gardens on a daily basis   3.  Depression/mood: Stable, not depressed   4.  Hearing: No perceived issues   5.  ADL's: Independent in all ADLs   6.  Fall risk: Low fall risk   7.  Home safety: No problems identified   8.  Height weight, and visual acuity: height and weight as above, vision:  Vision Screening   Right eye Left eye Both eyes  Without correction '20/50 20/50 20/40 '$  With correction        9.  Counseling: Healthy lifestyle changes and to update vaccination status   10. Lab orders based on risk factors:  Laboratory update will be reviewed   11. Referral : None today   12. Care plan: Follow-up with me in 6 to 12 months   13. Cognitive assessment: No cognitive impairment   14. Screening: Patient provided with a written and personalized 5-10 year screening schedule in the AVS. yes   15. Provider List Update: PCP only  16. Advance Directives: Full code   17. Opioids: Patient is not on any opioid prescriptions and has no risk factors for a substance use disorder.   [place] Office Visit from 12/29/2020 in [place] at [place]  PHQ-9 Total Score 0          11/29/2016    8:07 AM 12/07/2017    7:18 AM 12/25/2019    7:27 AM 12/29/2020    8:36 AM 12/30/2021    7:04 AM  Fall Risk  Falls in the past year? No No 0 0 0  Was there an injury with Fall?   0 0 0  Fall Risk Category Calculator   0 0 0  Fall Risk Category   Low Low  Low  Patient Fall Risk Level     Low fall risk  Patient at Risk for Falls Due to     No Fall Risks  Fall risk Follow up     Falls evaluation completed     Impression and Plan:  Encounter for preventive health examination -Recommend routine eye and dental care. -Immunizations: Due for pneumonia, COVID, flu, shingles vaccinations.  Declines all today despite counseling -Healthy lifestyle discussed in detail. -Labs to be updated today. -Colon cancer screening: 12/2016 -Breast cancer screening: 04/2021 -Cervical cancer screening: Declines due to age -Lung cancer screening: Not applicable, never smoker -Prostate cancer screening: Not applicable -DEXA: 45/8099  Vitamin B12 deficiency  - Plan: Vitamin B12  Mixed hyperlipidemia  - Plan: Lipid panel  Hypothyroidism, unspecified type  - Plan: TSH, Vitamin B12  Colitis, collagenous - followed by Dr. Collene Mares at [place]: CBC with Differential/Platelet, Comprehensive metabolic panel, Hemoglobin A1c, VITAMIN D 25 Hydroxy (Vit-D Deficiency, Fractures)     Patient Instructions  -Nice  seeing you today!!  -Lab work today; will notify you once results are available.  -Consider your COVID, pneumonia and shingles vaccines at the pharmacy.  -See you back in 6 months.      Lelon Frohlich, MD [facility] at [facility]

## 2021-12-31 ENCOUNTER — Telehealth: Payer: Self-pay | Admitting: Internal Medicine

## 2021-12-31 MED ORDER — ATORVASTATIN CALCIUM 40 MG PO TABS: 40.0000 mg | ORAL_TABLET | Freq: Every day | ORAL | 1 refills | Status: DC

## 2021-12-31 NOTE — Telephone Encounter (Signed)
Fyi pt husband is calling to let md know atorvastatin 40 mg #90  cost is 12.92 and at [facility] the cost is 90.00 dollars for 90 day supply of atorvastatin 40 mg

## 2021-12-31 NOTE — Addendum Note (Signed)
Addended by: Agnes Lawrence on: 12/31/2021 09:36 AM   Modules accepted: Orders

## 2021-12-31 NOTE — Addendum Note (Signed)
Addended by: Agnes Lawrence on: 12/31/2021 09:35 AM   Modules accepted: Orders

## 2022-01-03 ENCOUNTER — Encounter: Payer: Self-pay | Admitting: Internal Medicine

## 2022-01-03 NOTE — Telephone Encounter (Signed)
Communicated via [place].

## 2022-01-03 NOTE — Telephone Encounter (Signed)
Left message on machine for patient. Which pharmacy would patient like to use?

## 2022-01-19 ENCOUNTER — Encounter: Payer: Self-pay | Admitting: Internal Medicine

## 2022-04-08 ENCOUNTER — Other Ambulatory Visit: Payer: Medicare Other

## 2022-04-08 DIAGNOSIS — E782 Mixed hyperlipidemia: Secondary | ICD-10-CM

## 2022-04-08 LAB — LIPID PANEL
Cholesterol: 186 mg/dL (ref 0–200)
HDL: 72.6 mg/dL (ref >39.00)
LDL Cholesterol: 102 mg/dL — ABNORMAL HIGH (ref 0–99)
NonHDL: 113.82
Total CHOL/HDL Ratio: 3
Triglycerides: 59 mg/dL (ref 0.0–149.0)
VLDL: 11.8 mg/dL (ref 0.0–40.0)

## 2022-04-14 ENCOUNTER — Other Ambulatory Visit: Payer: Self-pay | Admitting: Internal Medicine

## 2022-04-14 DIAGNOSIS — E039 Hypothyroidism, unspecified: Secondary | ICD-10-CM

## 2022-04-23 ENCOUNTER — Other Ambulatory Visit: Payer: Self-pay | Admitting: Internal Medicine

## 2022-06-25 ENCOUNTER — Other Ambulatory Visit: Payer: Self-pay | Admitting: Internal Medicine

## 2022-06-25 DIAGNOSIS — E782 Mixed hyperlipidemia: Secondary | ICD-10-CM

## 2022-09-15 ENCOUNTER — Other Ambulatory Visit: Payer: Self-pay | Admitting: Internal Medicine

## 2022-09-15 DIAGNOSIS — E039 Hypothyroidism, unspecified: Secondary | ICD-10-CM

## 2022-12-23 ENCOUNTER — Other Ambulatory Visit: Payer: Self-pay | Admitting: Internal Medicine

## 2022-12-23 DIAGNOSIS — E782 Mixed hyperlipidemia: Secondary | ICD-10-CM

## 2023-01-03 ENCOUNTER — Ambulatory Visit (INDEPENDENT_AMBULATORY_CARE_PROVIDER_SITE_OTHER): Payer: Medicare Other | Admitting: Internal Medicine

## 2023-01-03 ENCOUNTER — Encounter: Payer: Self-pay | Admitting: Internal Medicine

## 2023-01-03 VITALS — BP 120/80 | HR 59 | Temp 98.9°F | Ht 60.5 in | Wt 93.9 lb

## 2023-01-03 DIAGNOSIS — Z0000 Encounter for general adult medical examination without abnormal findings: Secondary | ICD-10-CM | POA: Diagnosis not present

## 2023-01-03 DIAGNOSIS — Z1159 Encounter for screening for other viral diseases: Secondary | ICD-10-CM

## 2023-01-03 DIAGNOSIS — E039 Hypothyroidism, unspecified: Secondary | ICD-10-CM | POA: Diagnosis not present

## 2023-01-03 DIAGNOSIS — E559 Vitamin D deficiency, unspecified: Secondary | ICD-10-CM | POA: Diagnosis not present

## 2023-01-03 DIAGNOSIS — E782 Mixed hyperlipidemia: Secondary | ICD-10-CM

## 2023-01-03 DIAGNOSIS — E538 Deficiency of other specified B group vitamins: Secondary | ICD-10-CM | POA: Diagnosis not present

## 2023-01-03 LAB — CBC WITH DIFFERENTIAL/PLATELET
Basophils Absolute: 0.1 10*3/uL (ref 0.0–0.1)
Basophils Relative: 1.5 % (ref 0.0–3.0)
Eosinophils Absolute: 0.1 10*3/uL (ref 0.0–0.7)
Eosinophils Relative: 3.1 % (ref 0.0–5.0)
HCT: 41.7 % (ref 36.0–46.0)
Hemoglobin: 13.7 g/dL (ref 12.0–15.0)
Lymphocytes Relative: 30.9 % (ref 12.0–46.0)
Lymphs Abs: 1.5 10*3/uL (ref 0.7–4.0)
MCHC: 32.9 g/dL (ref 30.0–36.0)
MCV: 92.4 fl (ref 78.0–100.0)
Monocytes Absolute: 0.4 10*3/uL (ref 0.1–1.0)
Monocytes Relative: 9.1 % (ref 3.0–12.0)
Neutro Abs: 2.6 10*3/uL (ref 1.4–7.7)
Neutrophils Relative %: 55.4 % (ref 43.0–77.0)
Platelets: 285 10*3/uL (ref 150.0–400.0)
RBC: 4.51 Mil/uL (ref 3.87–5.11)
RDW: 13.4 % (ref 11.5–15.5)
WBC: 4.8 10*3/uL (ref 4.0–10.5)

## 2023-01-03 LAB — COMPREHENSIVE METABOLIC PANEL
ALT: 41 U/L — ABNORMAL HIGH (ref 0–35)
AST: 38 U/L — ABNORMAL HIGH (ref 0–37)
Albumin: 4.4 g/dL (ref 3.5–5.2)
Alkaline Phosphatase: 94 U/L (ref 39–117)
BUN: 17 mg/dL (ref 6–23)
CO2: 26 mEq/L (ref 19–32)
Calcium: 9.7 mg/dL (ref 8.4–10.5)
Chloride: 104 mEq/L (ref 96–112)
Creatinine, Ser: 1.11 mg/dL (ref 0.40–1.20)
GFR: 47.14 mL/min — ABNORMAL LOW (ref >60.00)
Glucose, Bld: 84 mg/dL (ref 70–99)
Potassium: 4.3 mEq/L (ref 3.5–5.1)
Sodium: 140 mEq/L (ref 135–145)
Total Bilirubin: 0.7 mg/dL (ref 0.2–1.2)
Total Protein: 7.2 g/dL (ref 6.0–8.3)

## 2023-01-03 LAB — LIPID PANEL
Cholesterol: 196 mg/dL (ref 0–200)
HDL: 84.2 mg/dL (ref >39.00)
LDL Cholesterol: 96 mg/dL (ref 0–99)
NonHDL: 111.59
Total CHOL/HDL Ratio: 2
Triglycerides: 79 mg/dL (ref 0.0–149.0)
VLDL: 15.8 mg/dL (ref 0.0–40.0)

## 2023-01-03 LAB — VITAMIN B12: Vitamin B-12: >1501 pg/mL — ABNORMAL HIGH (ref 211–911)

## 2023-01-03 LAB — VITAMIN D 25 HYDROXY (VIT D DEFICIENCY, FRACTURES): VITD: 68.92 ng/mL (ref 30.00–100.00)

## 2023-01-03 LAB — TSH: TSH: 0.41 u[IU]/mL (ref 0.35–5.50)

## 2023-01-03 NOTE — Progress Notes (Signed)
Established Patient Office Visit     CC/Reason for Visit: Annual preventive exam and subsequent Medicare wellness visit  HPI: Diane Becker is a 82 y.o. female who is coming in today for the above mentioned reasons. Past Medical History is significant for: Hypothyroidism, hyperlipidemia, vitamin B12 and D deficiencies, history of collagenous colitis.  She has been feeling well and has no acute concerns or complaints.  She has routine eye and dental care.  She is overdue for flu, pneumonia, shingles, Tdap, RSV vaccines.  She has her mammogram scheduled for the fall, she has elected to do this on an every 2-year schedule.  She declines further colonoscopies or Pap smears, is overdue for bone density.   Past Medical/Surgical History: Past Medical History:  Diagnosis Date   Colitis, collagenous    Followed by Dr. Loreta Ave at [facility] in GI   Colon polyps    Osteopenia    on > 5 years of bisphosphonate in the past   Rosacea    sees dermatologist Dr. Nicholas Lose for this   Thyroid disease     Past Surgical History:  Procedure Laterality Date   ABDOMINAL HYSTERECTOMY  1984   ABDOMINAL HYSTERECTOMY     for menorrhagia, no cervix per pt   THYROID SURGERY  2009   Dr. Jamey Ripa     Social History:  reports that she has never smoked. She has never used smokeless tobacco. She reports that she does not drink alcohol and does not use drugs.  Allergies: Allergies  Allergen Reactions   Penicillins Hives   Amoxicillin Rash   Other Rash    "Cillins"    Family History:  Family History  Problem Relation Age of Onset   Colon cancer Unknown      Current Outpatient Medications:    atorvastatin (LIPITOR) 40 MG tablet, TAKE 1 TABLET BY MOUTH DAILY, Disp: 90 tablet, Rfl: 1   BIOTIN 5000 PO, Take by mouth daily., Disp: , Rfl:    Cholecalciferol (VITAMIN D3 PO), Take 1,000 Units by mouth daily., Disp: , Rfl:    cyanocobalamin (VITAMIN B12) 1000 MCG tablet, Take 1,000 mcg by mouth daily.,  Disp: , Rfl:    doxycycline (VIBRA-TABS) 100 MG tablet, Take 100 mg by mouth 2 (two) times daily., Disp: , Rfl:    levothyroxine (SYNTHROID) 75 MCG tablet, TAKE 1 TABLET BY MOUTH DAILY  BEFORE BREAKFAST, Disp: 90 tablet, Rfl: 1   Methylcobalamin (B12) 5000 MCG SUBL, Place under the tongue., Disp: , Rfl:    Probiotic Product (PROBIOTIC DAILY PO), Take by mouth daily., Disp: , Rfl:   Review of Systems:  Negative unless indicated in HPI.   Physical Exam: Vitals:   01/03/23 0704  BP: 120/80  Pulse: (!) 59  Temp: 98.9 F (37.2 C)  TempSrc: Oral  SpO2: 99%  Weight: 93 lb 14.4 oz (42.6 kg)  Height: 5' 0.5" (1.537 m)    Body mass index is 18.04 kg/m.   Physical Exam Vitals reviewed.  Constitutional:      General: She is not in acute distress.    Appearance: Normal appearance. She is not ill-appearing, toxic-appearing or diaphoretic.  HENT:     Head: Normocephalic.     Right Ear: Tympanic membrane, ear canal and external ear normal. There is no impacted cerumen.     Left Ear: Tympanic membrane, ear canal and external ear normal. There is no impacted cerumen.     Nose: Nose normal.     Mouth/Throat:  Mouth: Mucous membranes are moist.     Pharynx: Oropharynx is clear. No oropharyngeal exudate or posterior oropharyngeal erythema.  Eyes:     General: No scleral icterus.       Right eye: No discharge.        Left eye: No discharge.     Conjunctiva/sclera: Conjunctivae normal.     Pupils: Pupils are equal, round, and reactive to light.  Neck:     Vascular: No carotid bruit.  Cardiovascular:     Rate and Rhythm: Normal rate and regular rhythm.     Pulses: Normal pulses.     Heart sounds: Normal heart sounds.  Pulmonary:     Effort: Pulmonary effort is normal. No respiratory distress.     Breath sounds: Normal breath sounds.  Abdominal:     General: Abdomen is flat. Bowel sounds are normal.     Palpations: Abdomen is soft.  Musculoskeletal:        General: Normal range  of motion.     Cervical back: Normal range of motion.  Skin:    General: Skin is warm and dry.  Neurological:     General: No focal deficit present.     Mental Status: She is alert and oriented to person, place, and time. Mental status is at baseline.  Psychiatric:        Mood and Affect: Mood normal.        Behavior: Behavior normal.        Thought Content: Thought content normal.        Judgment: Judgment normal.    Subsequent Medicare wellness visit   1. Risk factors, based on past  M,S,F - Cardiac Risk Factors include: advanced age (>45men, >38 women)   2.  Physical activities: Dietary issues and exercise activities discussed:      3.  Depression/mood:  Flowsheet Row Office Visit from 12/29/2020 in [facility] Total Score 0        4.  ADL's:    01/02/2023    4:22 PM  In your present state of health, do you have any difficulty performing the following activities:  Hearing? 0  Vision? 0  Difficulty concentrating or making decisions? 0  Walking or climbing stairs? 0  Dressing or bathing? 0  Doing errands, shopping? 0  Preparing Food and eating ? N  Using the Toilet? N  In the past six months, have you accidently leaked urine? N  Do you have problems with loss of bowel control? N  Managing your Medications? Y  Managing your Finances? Y  Housekeeping or managing your Housekeeping? Y     5.  Fall risk:     11/29/2016    8:07 AM 12/07/2017    7:18 AM 12/25/2019    7:27 AM 12/29/2020    8:36 AM 12/30/2021    7:04 AM  Fall Risk  Falls in the past year? No No 0 0 0  Was there an injury with Fall?   0 0 0  Fall Risk Category Calculator   0 0 0  Fall Risk Category (Retired)   Low Low Low  (RETIRED) Patient Fall Risk Level     Low fall risk  Patient at Risk for Falls Due to     No Fall Risks  Fall risk Follow up     Falls evaluation completed     6.  Home safety: No problems identified   7.  Height weight, and visual acuity:  height and weight as above, vision/hearing: Vision Screening   Right eye Left eye Both eyes  Without correction 20/30 20/30 20/30   With correction        8.  Counseling: Counseling given: Not Answered    9. Lab orders based on risk factors: Laboratory update will be reviewed   10. Cognitive assessment:        01/02/2023    4:26 PM  6CIT Screen  What Year? 0 points  What month? 0 points  What time? 0 points  Count back from 20 0 points  Months in reverse 0 points  Repeat phrase 0 points  Total Score 0 points     11. Screening: Patient provided with a written and personalized 5-10 year screening schedule in the AVS. Health Maintenance  Topic Date Due   Hepatitis C Screening  Never done   Zoster (Shingles) Vaccine (1 of 2) Never done   Pneumonia Vaccine (1 of 1 - PCV) Never done   Colon Cancer Screening  12/28/2021   COVID-19 Vaccine (4 - 2023-24 season) 01/28/2022   Mammogram  05/11/2022   DTaP/Tdap/Td vaccine (2 - Td or Tdap) 07/11/2022   Medicare Annual Wellness Visit  12/31/2022   Flu Shot  12/29/2022   DEXA scan (bone density measurement)  Completed   HPV Vaccine  Aged Out    12. Provider List Update: Patient Care Team    Relationship Specialty Notifications Start End  Philip Aspen, Limmie Patricia, MD PCP - General Internal Medicine  09/13/18   Charna Elizabeth, MD Attending Physician Gastroenterology  03/06/12   Venancio Poisson, MD Attending Physician Dermatology  03/06/12      13. Advance Directives: Does Patient Have a Medical Advance Directive?: Yes Type of Advance Directive: Living will, Out of facility DNR (pink MOST or yellow form), Healthcare Power of Attorney Does patient want to make changes to medical advance directive?: No - Patient declined Copy of Healthcare Power of Attorney in Chart?: No - copy requested  14. Opioids: Patient is not on any opioid prescriptions and has no risk factors for a substance use disorder.   15.   Goals      Maintain current  health      Protect My Health     Timeframe:  Long-Range Goal Priority:  Medium Start Date:                             Expected End Date:                       Follow Up Date 01/02/2024    - schedule and keep appointment for annual check-up    Why is this important?   Screening tests can find diseases early when they are easier to treat.  Your doctor or nurse will talk with you about which tests are important for you.  Getting shots for common diseases like the flu and shingles will help prevent them.     Notes:          I have personally reviewed and noted the following in the patient's chart:   Medical and social history Use of alcohol, tobacco or illicit drugs  Current medications and supplements Functional ability and status Nutritional status Physical activity Advanced directives List of other physicians Hospitalizations, surgeries, and ER visits in previous 12 months Vitals Screenings to include cognitive, depression, and falls Referrals and appointments  In addition, I have  reviewed and discussed with patient certain preventive protocols, quality metrics, and best practice recommendations. A written personalized care plan for preventive services as well as general preventive health recommendations were provided to patient.   Impression and Plan:  Encounter for preventive health examination  Vitamin B12 deficiency -     Vitamin B12; Future  Mixed hyperlipidemia -     CBC with Differential/Platelet; Future -     Comprehensive metabolic panel; Future -     Lipid panel; Future  Hypothyroidism, unspecified type -     TSH; Future  Encounter for hepatitis C screening test for low risk patient -     Hepatitis C antibody; Future  Vitamin D deficiency -     VITAMIN D 25 Hydroxy (Vit-D Deficiency, Fractures); Future  -Recommend routine eye and dental care. -Healthy lifestyle discussed in detail. -Labs to be updated today. -Prostate cancer screening:  N/A Health Maintenance  Topic Date Due   Hepatitis C Screening  Never done   Zoster (Shingles) Vaccine (1 of 2) Never done   Pneumonia Vaccine (1 of 1 - PCV) Never done   Colon Cancer Screening  12/28/2021   COVID-19 Vaccine (4 - 2023-24 season) 01/28/2022   Mammogram  05/11/2022   DTaP/Tdap/Td vaccine (2 - Td or Tdap) 07/11/2022   Medicare Annual Wellness Visit  12/31/2022   Flu Shot  12/29/2022   DEXA scan (bone density measurement)  Completed   HPV Vaccine  Aged Out    -Declines all immunizations despite counseling. -Declines breast and cervical cancer screening. -Will arrange for DEXA scan.    Chaya Jan, MD [facility] at [facility]

## 2023-01-03 NOTE — Patient Instructions (Signed)
-  Nice seeing you today!!  -Lab work today; will notify you once results are available.   

## 2023-01-03 NOTE — Progress Notes (Signed)
Medicare Wellness question were answered 01/02/23.

## 2023-01-04 ENCOUNTER — Encounter: Payer: Self-pay | Admitting: Internal Medicine

## 2023-01-04 ENCOUNTER — Other Ambulatory Visit: Payer: Self-pay | Admitting: Internal Medicine

## 2023-01-04 DIAGNOSIS — R7401 Elevation of levels of liver transaminase levels: Secondary | ICD-10-CM | POA: Insufficient documentation

## 2023-02-16 ENCOUNTER — Other Ambulatory Visit: Payer: Medicare Other

## 2023-02-16 ENCOUNTER — Other Ambulatory Visit: Payer: Self-pay | Admitting: Internal Medicine

## 2023-02-16 ENCOUNTER — Encounter: Payer: Self-pay | Admitting: Internal Medicine

## 2023-02-16 ENCOUNTER — Other Ambulatory Visit: Payer: Self-pay | Admitting: *Deleted

## 2023-02-16 ENCOUNTER — Other Ambulatory Visit (INDEPENDENT_AMBULATORY_CARE_PROVIDER_SITE_OTHER): Payer: Medicare Other

## 2023-02-16 DIAGNOSIS — R7401 Elevation of levels of liver transaminase levels: Secondary | ICD-10-CM

## 2023-02-16 LAB — COMPREHENSIVE METABOLIC PANEL
ALT: 55 U/L — ABNORMAL HIGH (ref 0–35)
AST: 41 U/L — ABNORMAL HIGH (ref 0–37)
Albumin: 4.2 g/dL (ref 3.5–5.2)
Alkaline Phosphatase: 127 U/L — ABNORMAL HIGH (ref 39–117)
BUN: 22 mg/dL (ref 6–23)
CO2: 29 mEq/L (ref 19–32)
Calcium: 9.5 mg/dL (ref 8.4–10.5)
Chloride: 102 mEq/L (ref 96–112)
Creatinine, Ser: 1.15 mg/dL (ref 0.40–1.20)
GFR: 45.14 mL/min — ABNORMAL LOW (ref >60.00)
Glucose, Bld: 101 mg/dL — ABNORMAL HIGH (ref 70–99)
Potassium: 4 mEq/L (ref 3.5–5.1)
Sodium: 140 mEq/L (ref 135–145)
Total Bilirubin: 0.9 mg/dL (ref 0.2–1.2)
Total Protein: 7 g/dL (ref 6.0–8.3)

## 2023-02-20 ENCOUNTER — Other Ambulatory Visit (INDEPENDENT_AMBULATORY_CARE_PROVIDER_SITE_OTHER): Payer: Medicare Other

## 2023-02-20 DIAGNOSIS — R7401 Elevation of levels of liver transaminase levels: Secondary | ICD-10-CM

## 2023-02-21 ENCOUNTER — Ambulatory Visit (HOSPITAL_BASED_OUTPATIENT_CLINIC_OR_DEPARTMENT_OTHER)
Admission: RE | Admit: 2023-02-21 | Discharge: 2023-02-21 | Disposition: A | Discharge status: Home / Self Care | Payer: Medicare Other | Source: Ambulatory Visit | Attending: Internal Medicine | Admitting: Internal Medicine

## 2023-02-21 DIAGNOSIS — R7401 Elevation of levels of liver transaminase levels: Secondary | ICD-10-CM | POA: Insufficient documentation

## 2023-02-21 LAB — HEPATITIS PANEL, ACUTE
Hep A IgM: NONREACTIVE
Hep B C IgM: NONREACTIVE
Hepatitis B Surface Ag: NONREACTIVE
Hepatitis C Ab: NONREACTIVE

## 2023-03-02 ENCOUNTER — Encounter: Payer: Self-pay | Admitting: Internal Medicine

## 2023-03-06 NOTE — Telephone Encounter (Signed)
Please advise 

## 2023-03-06 NOTE — Telephone Encounter (Signed)
Pt is aware md is back in office

## 2023-03-07 ENCOUNTER — Encounter: Payer: Self-pay | Admitting: Internal Medicine

## 2023-03-08 NOTE — Telephone Encounter (Signed)
Please see prior Mychart message sent to the patient.

## 2023-03-08 NOTE — Telephone Encounter (Signed)
See prior Mychart message sent to the patient.

## 2023-04-20 ENCOUNTER — Other Ambulatory Visit (INDEPENDENT_AMBULATORY_CARE_PROVIDER_SITE_OTHER): Payer: Medicare Other

## 2023-04-20 DIAGNOSIS — R7401 Elevation of levels of liver transaminase levels: Secondary | ICD-10-CM | POA: Diagnosis not present

## 2023-04-21 LAB — HEPATITIS PANEL, ACUTE
Hep A IgM: NONREACTIVE
Hep B C IgM: NONREACTIVE
Hepatitis B Surface Ag: NONREACTIVE
Hepatitis C Ab: NONREACTIVE

## 2023-04-25 ENCOUNTER — Encounter: Payer: Self-pay | Admitting: Internal Medicine

## 2023-04-25 DIAGNOSIS — R7401 Elevation of levels of liver transaminase levels: Secondary | ICD-10-CM

## 2023-05-01 ENCOUNTER — Encounter: Payer: Self-pay | Admitting: Internal Medicine

## 2023-05-01 NOTE — Telephone Encounter (Signed)
Patient checking on progress of prior request for info

## 2023-05-02 ENCOUNTER — Other Ambulatory Visit: Payer: Medicare Other

## 2023-05-02 DIAGNOSIS — R7401 Elevation of levels of liver transaminase levels: Secondary | ICD-10-CM

## 2023-05-02 NOTE — Telephone Encounter (Signed)
Answered in Mychart message 05/02/23.

## 2023-05-03 ENCOUNTER — Other Ambulatory Visit: Payer: Self-pay | Admitting: Internal Medicine

## 2023-05-03 DIAGNOSIS — R7401 Elevation of levels of liver transaminase levels: Secondary | ICD-10-CM

## 2023-05-03 DIAGNOSIS — E782 Mixed hyperlipidemia: Secondary | ICD-10-CM

## 2023-05-03 LAB — COMPLETE METABOLIC PANEL WITH GFR
AG Ratio: 1.5 (calc) (ref 1.0–2.5)
ALT: 10 U/L (ref 6–29)
AST: 17 U/L (ref 10–35)
Albumin: 4 g/dL (ref 3.6–5.1)
Alkaline phosphatase (APISO): 67 U/L (ref 37–153)
BUN/Creatinine Ratio: 20 (calc) (ref 6–22)
BUN: 21 mg/dL (ref 7–25)
CO2: 31 mmol/L (ref 20–32)
Calcium: 9.3 mg/dL (ref 8.6–10.4)
Chloride: 101 mmol/L (ref 98–110)
Creat: 1.07 mg/dL — ABNORMAL HIGH (ref 0.60–0.95)
Globulin: 2.7 g/dL (ref 1.9–3.7)
Glucose, Bld: 89 mg/dL (ref 65–99)
Potassium: 4.1 mmol/L (ref 3.5–5.3)
Sodium: 139 mmol/L (ref 135–146)
Total Bilirubin: 0.6 mg/dL (ref 0.2–1.2)
Total Protein: 6.7 g/dL (ref 6.1–8.1)
eGFR: 53 mL/min/{1.73_m2} — ABNORMAL LOW (ref >=60)

## 2023-06-22 LAB — HM DEXA SCAN

## 2023-06-22 LAB — HM MAMMOGRAPHY

## 2023-06-27 ENCOUNTER — Other Ambulatory Visit: Payer: Self-pay | Admitting: Internal Medicine

## 2023-06-27 DIAGNOSIS — E039 Hypothyroidism, unspecified: Secondary | ICD-10-CM

## 2023-07-17 ENCOUNTER — Encounter: Payer: Self-pay | Admitting: Internal Medicine

## 2023-07-17 DIAGNOSIS — E039 Hypothyroidism, unspecified: Secondary | ICD-10-CM

## 2023-07-17 MED ORDER — LEVOTHYROXINE SODIUM 75 MCG PO TABS: 75.0000 ug | ORAL_TABLET | Freq: Every day | ORAL | 1 refills | Status: DC

## 2023-08-28 ENCOUNTER — Encounter: Payer: Self-pay | Admitting: Internal Medicine

## 2023-08-28 ENCOUNTER — Other Ambulatory Visit (INDEPENDENT_AMBULATORY_CARE_PROVIDER_SITE_OTHER): Payer: Medicare Other

## 2023-08-28 DIAGNOSIS — E782 Mixed hyperlipidemia: Secondary | ICD-10-CM | POA: Diagnosis not present

## 2023-08-28 DIAGNOSIS — R7401 Elevation of levels of liver transaminase levels: Secondary | ICD-10-CM

## 2023-08-28 LAB — COMPREHENSIVE METABOLIC PANEL WITH GFR
ALT: 13 U/L (ref 0–35)
AST: 21 U/L (ref 0–37)
Albumin: 4.2 g/dL (ref 3.5–5.2)
Alkaline Phosphatase: 58 U/L (ref 39–117)
BUN: 22 mg/dL (ref 6–23)
CO2: 26 meq/L (ref 19–32)
Calcium: 9.2 mg/dL (ref 8.4–10.5)
Chloride: 103 meq/L (ref 96–112)
Creatinine, Ser: 1.07 mg/dL (ref 0.40–1.20)
GFR: 49.04 mL/min — ABNORMAL LOW (ref >60.00)
Glucose, Bld: 81 mg/dL (ref 70–99)
Potassium: 3.9 meq/L (ref 3.5–5.1)
Sodium: 139 meq/L (ref 135–145)
Total Bilirubin: 0.6 mg/dL (ref 0.2–1.2)
Total Protein: 7 g/dL (ref 6.0–8.3)

## 2023-08-28 LAB — LIPID PANEL
Cholesterol: 266 mg/dL — ABNORMAL HIGH (ref 0–200)
HDL: 83.1 mg/dL (ref >39.00)
LDL Cholesterol: 167 mg/dL — ABNORMAL HIGH (ref 0–99)
NonHDL: 182.94
Total CHOL/HDL Ratio: 3
Triglycerides: 78 mg/dL (ref 0.0–149.0)
VLDL: 15.6 mg/dL (ref 0.0–40.0)

## 2023-09-04 ENCOUNTER — Encounter: Payer: Self-pay | Admitting: Internal Medicine

## 2023-09-04 ENCOUNTER — Ambulatory Visit: Admitting: Family Medicine

## 2023-09-04 VITALS — BP 100/62 | HR 65 | Temp 98.0°F | Wt 93.0 lb

## 2023-09-04 DIAGNOSIS — H6123 Impacted cerumen, bilateral: Secondary | ICD-10-CM | POA: Diagnosis not present

## 2023-09-04 NOTE — Progress Notes (Signed)
   Acute Office Visit   Subjective:  Patient ID: Diane Becker, female    DOB: 1940/12/31, 83 y.o.   MRN: [id number]  Chief Complaint  Patient presents with   Ear Fullness    HPI:  Patient is here for ear lavage. Her ears feel full and hard of hearing.  ROS See HPI above      Objective:   BP 100/62   Pulse 65   Temp 98 F (36.7 C) (Oral)   Wt 93 lb (42.2 kg)   SpO2 98%   BMI 17.86 kg/m    Physical Exam Vitals reviewed.  Constitutional:      General: She is not in acute distress.    Appearance: Normal appearance. She is not ill-appearing, toxic-appearing or diaphoretic.  HENT:     Right Ear: There is impacted cerumen.     Left Ear: There is impacted cerumen.  Eyes:     General:        Right eye: No discharge.        Left eye: No discharge.     Conjunctiva/sclera: Conjunctivae normal.  Cardiovascular:     Rate and Rhythm: Normal rate.  Pulmonary:     Effort: Pulmonary effort is normal. No respiratory distress.  Musculoskeletal:        General: Normal range of motion.  Skin:    General: Skin is warm and dry.  Neurological:     General: No focal deficit present.     Mental Status: She is alert and oriented to person, place, and time. Mental status is at baseline.  Psychiatric:        Mood and Affect: Mood normal.        Behavior: Behavior normal.        Thought Content: Thought content normal.        Judgment: Judgment normal.    PRE-PROCEDURE EXAM: Left and Right TM cannot be visualized due to total occlusion/impaction of the ear canal. PROCEDURE INDICATION: Remove wax to visualize ear drum & relieve discomfort CONSENT:  Verbalto  PROCEDURE NOTE: Left and Right ear: [employer], irrigated both ears with warm water and ear drops to remove the wax. 100% of the wax was removed.  POST- PROCEDURE EXAM: TMs successfully visualized. TM with some erythema in right ear and non in left ear. The patient tolerated the procedure well.       Assessment & Plan:   Hearing loss of both ears due to cerumen impaction  -Cleansed both ear canals (ear lavage) due to impaction. Recommend to use over the counter Debrox every 3-6 months to cleanse ear canals. If not successful, can make an appointment to have ear lavage completed in office.  -Right ear is not completely cleansed but impaction if resolved. Ear canal is red and irritated. Recommend to Debrox again in 2-3 days to remove the remaining ear wax.  Zandra Abts, NP

## 2023-09-04 NOTE — Patient Instructions (Addendum)
-  It was a pleasure to take care of you today. -Cleansed both ear canals (ear lavage) due to impaction. Recommend to use over the counter Debrox every 3-6 months to cleanse ear canals. If not successful, can make an appointment to have ear lavage completed in office.  -Right ear is not completely cleansed but impaction if resolved. Ear canal is red and irritated. Recommend to Debrox again in 2-3 days to remove the remaining ear wax.

## 2023-12-04 ENCOUNTER — Other Ambulatory Visit (INDEPENDENT_AMBULATORY_CARE_PROVIDER_SITE_OTHER)

## 2023-12-04 DIAGNOSIS — R7401 Elevation of levels of liver transaminase levels: Secondary | ICD-10-CM

## 2023-12-04 DIAGNOSIS — E782 Mixed hyperlipidemia: Secondary | ICD-10-CM

## 2023-12-04 LAB — LIPID PANEL
Cholesterol: 168 mg/dL (ref 0–200)
HDL: 70 mg/dL (ref >39.00)
LDL Cholesterol: 85 mg/dL (ref 0–99)
NonHDL: 98.43
Total CHOL/HDL Ratio: 2
Triglycerides: 69 mg/dL (ref 0.0–149.0)
VLDL: 13.8 mg/dL (ref 0.0–40.0)

## 2023-12-04 LAB — COMPLETE METABOLIC PANEL WITHOUT GFR
AG Ratio: 1.7 (calc) (ref 1.0–2.5)
ALT: 23 U/L (ref 6–29)
AST: 28 U/L (ref 10–35)
Albumin: 4.1 g/dL (ref 3.6–5.1)
Alkaline phosphatase (APISO): 75 U/L (ref 37–153)
BUN/Creatinine Ratio: 17 (calc) (ref 6–22)
BUN: 19 mg/dL (ref 7–25)
CO2: 25 mmol/L (ref 20–32)
Calcium: 9.2 mg/dL (ref 8.6–10.4)
Chloride: 105 mmol/L (ref 98–110)
Creat: 1.11 mg/dL — ABNORMAL HIGH (ref 0.60–0.95)
Globulin: 2.4 g/dL (ref 1.9–3.7)
Glucose, Bld: 87 mg/dL (ref 65–99)
Potassium: 4.1 mmol/L (ref 3.5–5.3)
Sodium: 139 mmol/L (ref 135–146)
Total Bilirubin: 0.7 mg/dL (ref 0.2–1.2)
Total Protein: 6.5 g/dL (ref 6.1–8.1)

## 2023-12-05 ENCOUNTER — Ambulatory Visit: Payer: Self-pay | Admitting: Internal Medicine

## 2023-12-27 ENCOUNTER — Other Ambulatory Visit: Payer: Self-pay | Admitting: *Deleted

## 2023-12-27 DIAGNOSIS — E782 Mixed hyperlipidemia: Secondary | ICD-10-CM

## 2023-12-27 MED ORDER — ATORVASTATIN CALCIUM 40 MG PO TABS: 40.0000 mg | ORAL_TABLET | Freq: Every day | ORAL | 1 refills | Status: DC

## 2024-01-25 ENCOUNTER — Other Ambulatory Visit: Payer: Self-pay | Admitting: *Deleted

## 2024-01-25 DIAGNOSIS — E039 Hypothyroidism, unspecified: Secondary | ICD-10-CM

## 2024-01-25 MED ORDER — LEVOTHYROXINE SODIUM 75 MCG PO TABS: 75.0000 ug | ORAL_TABLET | Freq: Every day | ORAL | 1 refills | Status: DC

## 2024-01-30 ENCOUNTER — Encounter: Payer: Self-pay | Admitting: Internal Medicine

## 2024-01-30 ENCOUNTER — Ambulatory Visit (INDEPENDENT_AMBULATORY_CARE_PROVIDER_SITE_OTHER): Admitting: Internal Medicine

## 2024-01-30 VITALS — BP 120/70 | HR 55 | Temp 98.0°F | Ht 60.0 in | Wt 93.8 lb

## 2024-01-30 DIAGNOSIS — Z0000 Encounter for general adult medical examination without abnormal findings: Secondary | ICD-10-CM

## 2024-01-30 DIAGNOSIS — E538 Deficiency of other specified B group vitamins: Secondary | ICD-10-CM | POA: Diagnosis not present

## 2024-01-30 DIAGNOSIS — E039 Hypothyroidism, unspecified: Secondary | ICD-10-CM

## 2024-01-30 DIAGNOSIS — E782 Mixed hyperlipidemia: Secondary | ICD-10-CM

## 2024-01-30 DIAGNOSIS — E559 Vitamin D deficiency, unspecified: Secondary | ICD-10-CM

## 2024-01-30 DIAGNOSIS — K52831 Collagenous colitis: Secondary | ICD-10-CM

## 2024-01-30 LAB — CBC WITH DIFFERENTIAL/PLATELET
Basophils Absolute: 0.1 K/uL (ref 0.0–0.1)
Basophils Relative: 1.5 % (ref 0.0–3.0)
Eosinophils Absolute: 0.2 K/uL (ref 0.0–0.7)
Eosinophils Relative: 4 % (ref 0.0–5.0)
HCT: 40.2 % (ref 36.0–46.0)
Hemoglobin: 13.3 g/dL (ref 12.0–15.0)
Lymphocytes Relative: 34.4 % (ref 12.0–46.0)
Lymphs Abs: 1.4 K/uL (ref 0.7–4.0)
MCHC: 33.1 g/dL (ref 30.0–36.0)
MCV: 90.5 fl (ref 78.0–100.0)
Monocytes Absolute: 0.4 K/uL (ref 0.1–1.0)
Monocytes Relative: 9.8 % (ref 3.0–12.0)
Neutro Abs: 2 K/uL (ref 1.4–7.7)
Neutrophils Relative %: 50.3 % (ref 43.0–77.0)
Platelets: 258 K/uL (ref 150.0–400.0)
RBC: 4.44 Mil/uL (ref 3.87–5.11)
RDW: 14 % (ref 11.5–15.5)
WBC: 4 K/uL (ref 4.0–10.5)

## 2024-01-30 LAB — COMPREHENSIVE METABOLIC PANEL WITH GFR
ALT: 18 U/L (ref 0–35)
AST: 25 U/L (ref 0–37)
Albumin: 4 g/dL (ref 3.5–5.2)
Alkaline Phosphatase: 68 U/L (ref 39–117)
BUN: 21 mg/dL (ref 6–23)
CO2: 28 meq/L (ref 19–32)
Calcium: 9.1 mg/dL (ref 8.4–10.5)
Chloride: 103 meq/L (ref 96–112)
Creatinine, Ser: 1.15 mg/dL (ref 0.40–1.20)
GFR: 44.84 mL/min — ABNORMAL LOW (ref >60.00)
Glucose, Bld: 94 mg/dL (ref 70–99)
Potassium: 4.4 meq/L (ref 3.5–5.1)
Sodium: 140 meq/L (ref 135–145)
Total Bilirubin: 0.6 mg/dL (ref 0.2–1.2)
Total Protein: 6.9 g/dL (ref 6.0–8.3)

## 2024-01-30 LAB — LIPID PANEL
Cholesterol: 162 mg/dL (ref 0–200)
HDL: 70.4 mg/dL (ref >39.00)
LDL Cholesterol: 80 mg/dL (ref 0–99)
NonHDL: 91.79
Total CHOL/HDL Ratio: 2
Triglycerides: 57 mg/dL (ref 0.0–149.0)
VLDL: 11.4 mg/dL (ref 0.0–40.0)

## 2024-01-30 LAB — TSH: TSH: 0.15 u[IU]/mL — ABNORMAL LOW (ref 0.35–5.50)

## 2024-01-30 LAB — VITAMIN B12: Vitamin B-12: >1500 pg/mL — ABNORMAL HIGH (ref 211–911)

## 2024-01-30 LAB — VITAMIN D 25 HYDROXY (VIT D DEFICIENCY, FRACTURES): VITD: 47.44 ng/mL (ref 30.00–100.00)

## 2024-01-30 NOTE — Progress Notes (Signed)
 Established Patient Office Visit     CC/Reason for Visit: Annual preventive exam and subsequent Medicare wellness visit  HPI: Diane Becker is a 83 y.o. female who is coming in today for the above mentioned reasons. Past Medical History is significant for: Vitamin D  and B12 deficiencies, history of transaminitis, hyperlipidemia, hypothyroidism, history of collagenous colitis.  Has been doing well, no major concerns or complaints.  Is due for Tdap, shingles, pneumonia, influenza vaccines.  Cancer screening is up-to-date.   Past Medical/Surgical History: Past Medical History:  Diagnosis Date   Colitis, collagenous    Followed by Dr. Kristie at [facility] in GI   Colon polyps    Osteopenia    on > 5 years of bisphosphonate in the past   Rosacea    sees dermatologist Dr. Lomax for this   Thyroid  disease     Past Surgical History:  Procedure Laterality Date   ABDOMINAL HYSTERECTOMY  1984   ABDOMINAL HYSTERECTOMY     for menorrhagia, no cervix per pt   THYROID  SURGERY  2009   Dr. Merrilyn     Social History:  reports that she has never smoked. She has never used smokeless tobacco. She reports that she does not drink alcohol  and does not use drugs.  Allergies: Allergies  Allergen Reactions   Penicillins Hives   Amoxicillin Rash   Other Rash    Cillins    Family History:  Family History  Problem Relation Age of Onset   Colon cancer Unknown      Current Outpatient Medications:    atorvastatin  (LIPITOR) 40 MG tablet, Take 1 tablet (40 mg total) by mouth daily., Disp: 90 tablet, Rfl: 1   BIOTIN 5000 PO, Take by mouth daily., Disp: , Rfl:    Cholecalciferol (VITAMIN D3 PO), Take 1,000 Units by mouth daily., Disp: , Rfl:    cyanocobalamin  (VITAMIN B12) 1000 MCG tablet, Take 1,000 mcg by mouth daily., Disp: , Rfl:    doxycycline (VIBRA-TABS) 100 MG tablet, Take 100 mg by mouth 2 (two) times daily., Disp: , Rfl:    levothyroxine  (SYNTHROID ) 75 MCG tablet, Take 1  tablet (75 mcg total) by mouth daily before breakfast., Disp: 90 tablet, Rfl: 1   Methylcobalamin (B12) 5000 MCG SUBL, Place under the tongue., Disp: , Rfl:    Probiotic Product (PROBIOTIC DAILY PO), Take by mouth daily., Disp: , Rfl:   Review of Systems:  Negative unless indicated in HPI.   Physical Exam: Vitals:   01/30/24 0751  BP: 120/70  Pulse: (!) 55  Temp: 98 F (36.7 C)  TempSrc: Oral  SpO2: 99%  Weight: 93 lb 12.8 oz (42.5 kg)  Height: 5' (1.524 m)    Body mass index is 18.32 kg/m.   Physical Exam Vitals reviewed.  Constitutional:      General: She is not in acute distress.    Appearance: Normal appearance. She is not ill-appearing, toxic-appearing or diaphoretic.  HENT:     Head: Normocephalic.     Right Ear: Tympanic membrane, ear canal and external ear normal. There is no impacted cerumen.     Left Ear: Tympanic membrane, ear canal and external ear normal. There is no impacted cerumen.     Nose: Nose normal.     Mouth/Throat:     Mouth: Mucous membranes are moist.     Pharynx: Oropharynx is clear. No oropharyngeal exudate or posterior oropharyngeal erythema.  Eyes:     General: No scleral icterus.  Right eye: No discharge.        Left eye: No discharge.     Conjunctiva/sclera: Conjunctivae normal.     Pupils: Pupils are equal, round, and reactive to light.  Neck:     Vascular: No carotid bruit.  Cardiovascular:     Rate and Rhythm: Normal rate and regular rhythm.     Pulses: Normal pulses.     Heart sounds: Normal heart sounds.  Pulmonary:     Effort: Pulmonary effort is normal. No respiratory distress.     Breath sounds: Normal breath sounds.  Abdominal:     General: Abdomen is flat. Bowel sounds are normal.     Palpations: Abdomen is soft.  Musculoskeletal:        General: Normal range of motion.     Cervical back: Normal range of motion.  Skin:    General: Skin is warm and dry.  Neurological:     General: No focal deficit present.      Mental Status: She is alert and oriented to person, place, and time. Mental status is at baseline.  Psychiatric:        Mood and Affect: Mood normal.        Behavior: Behavior normal.        Thought Content: Thought content normal.        Judgment: Judgment normal.     Subsequent Medicare wellness visit   1. Risk factors, based on past  M,S,F - Cardiac Risk Factors include: advanced age (>93men, >74 women)   2.  Physical activities: Dietary issues and exercise activities discussed:      3.  Depression/mood:  Flowsheet Row Office Visit from 01/03/2023 in [facility] Total Score 0     4.  ADL's:    01/30/2024    7:50 AM  In your present state of health, do you have any difficulty performing the following activities:  Hearing? 0  Vision? 0  Difficulty concentrating or making decisions? 0  Walking or climbing stairs? 0  Dressing or bathing? 0  Doing errands, shopping? 0  Preparing Food and eating ? N  Using the Toilet? N  In the past six months, have you accidently leaked urine? N  Do you have problems with loss of bowel control? N  Managing your Medications? N  Managing your Finances? N  Housekeeping or managing your Housekeeping? N     5.  Fall risk:     12/25/2019    7:27 AM 12/29/2020    8:36 AM 12/30/2021    7:04 AM 01/03/2023    7:24 AM 01/30/2024    7:52 AM  Fall Risk  Falls in the past year? 0 0 0 0 0  Was there an injury with Fall? 0 0 0 0 0  Fall Risk Category Calculator 0 0 0 0 0  Fall Risk Category (Retired) Low  Low  Low     (RETIRED) Patient Fall Risk Level   Low fall risk     Patient at Risk for Falls Due to   No Fall Risks    Fall risk Follow up   Falls evaluation completed  Falls evaluation completed Falls evaluation completed     Data saved with a previous flowsheet row definition     6.  Home safety: No problems identified   7.  Height weight, and visual acuity: height and weight as above, vision/hearing: Vision  Screening   Right eye Left eye Both eyes  Without correction 20/30 20/30 20/30   With correction        8.  Counseling: Counseling given: Not Answered    9. Lab orders based on risk factors: Laboratory update will be reviewed   10. Cognitive assessment:        01/30/2024    7:54 AM 01/02/2023    4:26 PM  6CIT Screen  What Year? 0 points 0 points  What month? 0 points 0 points  What time? 0 points 0 points  Count back from 20 0 points 0 points  Months in reverse 0 points 0 points  Repeat phrase 0 points 0 points  Total Score 0 points 0 points     11. Screening: Patient provided with a written and personalized 5-10 year screening schedule in the AVS. Health Maintenance  Topic Date Due   Pneumococcal Vaccine for age over 75 (1 of 1 - PCV) Never done   Zoster (Shingles) Vaccine (1 of 2) Never done   DTaP/Tdap/Td vaccine (2 - Td or Tdap) 07/11/2022   Flu Shot  12/29/2023   COVID-19 Vaccine (4 - 2025-26 season) 01/29/2024   Mammogram  06/21/2024   Medicare Annual Wellness Visit  01/29/2025   DEXA scan (bone density measurement)  Completed   HPV Vaccine  Aged Out   Meningitis B Vaccine  Aged Out   Colon Cancer Screening  Discontinued   Hepatitis C Screening  Discontinued    12. Provider List Update: Patient Care Team    Relationship Specialty Notifications Start End  Theophilus Andrews, Tully GRADE, MD PCP - General Internal Medicine  09/13/18   Kristie Lamprey, MD Attending Physician Gastroenterology  03/06/12   Cary Doffing, MD Attending Physician Dermatology  03/06/12      13. Advance Directives: Does Patient Have a Medical Advance Directive?: Yes Type of Advance Directive: Healthcare Power of Attorney, Living will Does patient want to make changes to medical advance directive?: No - Patient declined Copy of Healthcare Power of Attorney in Chart?: No - copy requested  14. Opioids: Patient is not on any opioid prescriptions and has no risk factors for a substance use  disorder.   15.   Goals      Maintain current health      Protect My Health     Timeframe:  Long-Range Goal Priority:  Medium Start Date:                             Expected End Date:                       Follow Up Date 01/02/2024    - schedule and keep appointment for annual check-up    Why is this important?   Screening tests can find diseases early when they are easier to treat.  Your doctor or nurse will talk with you about which tests are important for you.  Getting shots for common diseases like the flu and shingles will help prevent them.     Notes:      Protect My Health           Why is this important?   Screening tests can find diseases early when they are easier to treat.  Your doctor or nurse will talk with you about which tests are important for you.  Getting shots for common diseases like the flu and shingles will help prevent them.     Notes:  I have personally reviewed and noted the following in the patient's chart:   Medical and social history Use of alcohol , tobacco or illicit drugs  Current medications and supplements Functional ability and status Nutritional status Physical activity Advanced directives List of other physicians Hospitalizations, surgeries, and ER visits in previous 12 months Vitals Screenings to include cognitive, depression, and falls Referrals and appointments  In addition, I have reviewed and discussed with patient certain preventive protocols, quality metrics, and best practice recommendations. A written personalized care plan for preventive services as well as general preventive health recommendations were provided to patient.  Impression and Plan:  Encounter for preventive health examination  Hypothyroidism, unspecified type -     TSH; Future  Mixed hyperlipidemia -     CBC with Differential/Platelet; Future -     Comprehensive metabolic panel with GFR; Future -     Lipid panel; Future  Vitamin  B12 deficiency -     Vitamin B12; Future  Vitamin D  deficiency -     VITAMIN D  25 Hydroxy (Vit-D Deficiency, Fractures); Future  Colitis, collagenous - followed by Dr. Kristie at [facility]   -Recommend routine eye and dental care. -Healthy lifestyle discussed in detail. -Labs to be updated today. -Prostate cancer screening: N/A Health Maintenance  Topic Date Due   Pneumococcal Vaccine for age over 40 (1 of 1 - PCV) Never done   Zoster (Shingles) Vaccine (1 of 2) Never done   DTaP/Tdap/Td vaccine (2 - Td or Tdap) 07/11/2022   Flu Shot  12/29/2023   COVID-19 Vaccine (4 - 2025-26 season) 01/29/2024   Mammogram  06/21/2024   Medicare Annual Wellness Visit  01/29/2025   DEXA scan (bone density measurement)  Completed   HPV Vaccine  Aged Out   Meningitis B Vaccine  Aged Out   Colon Cancer Screening  Discontinued   Hepatitis C Screening  Discontinued     - Declines all age-appropriate vaccines despite counseling.    Tully Theophilus Andrews, MD [facility]

## 2024-02-07 ENCOUNTER — Encounter: Payer: Self-pay | Admitting: Internal Medicine

## 2024-02-07 ENCOUNTER — Ambulatory Visit: Payer: Self-pay | Admitting: Internal Medicine

## 2024-02-07 DIAGNOSIS — E039 Hypothyroidism, unspecified: Secondary | ICD-10-CM

## 2024-02-07 MED ORDER — LEVOTHYROXINE SODIUM 50 MCG PO TABS: 50.0000 ug | ORAL_TABLET | Freq: Every day | ORAL | 1 refills | Status: DC

## 2024-02-12 ENCOUNTER — Telehealth: Payer: Self-pay

## 2024-02-12 DIAGNOSIS — E039 Hypothyroidism, unspecified: Secondary | ICD-10-CM

## 2024-02-12 MED ORDER — LEVOTHYROXINE SODIUM 50 MCG PO TABS: 50.0000 ug | ORAL_TABLET | Freq: Every day | ORAL | 1 refills | Status: DC

## 2024-02-12 NOTE — Telephone Encounter (Signed)
 Copied from CRM [id number]. Topic: General - Other >> Feb 12, 2024 10:48 AM Macario HERO wrote: Reason for CRM: Patient requesting a call from the person who orders the medication, I asked if it was the pharmacist, she does not know.

## 2024-02-12 NOTE — Telephone Encounter (Signed)
 Refill sent to [facility] per patient request.

## 2024-02-12 NOTE — Addendum Note (Signed)
 Addended by: KATHRYNE MILLMAN B on: 02/12/2024 04:51 PM   Modules accepted: Orders

## 2024-02-20 ENCOUNTER — Ambulatory Visit: Admitting: Family Medicine

## 2024-03-04 ENCOUNTER — Ambulatory Visit

## 2024-03-11 ENCOUNTER — Other Ambulatory Visit

## 2024-04-01 ENCOUNTER — Other Ambulatory Visit (INDEPENDENT_AMBULATORY_CARE_PROVIDER_SITE_OTHER)

## 2024-04-01 DIAGNOSIS — E039 Hypothyroidism, unspecified: Secondary | ICD-10-CM

## 2024-04-01 LAB — TSH: TSH: 2.59 u[IU]/mL (ref 0.35–5.50)

## 2024-04-08 ENCOUNTER — Ambulatory Visit: Payer: Self-pay | Admitting: Internal Medicine

## 2024-05-04 ENCOUNTER — Other Ambulatory Visit: Payer: Self-pay | Admitting: Internal Medicine

## 2024-05-04 DIAGNOSIS — E039 Hypothyroidism, unspecified: Secondary | ICD-10-CM

## 2024-07-01 ENCOUNTER — Other Ambulatory Visit: Payer: Self-pay | Admitting: Internal Medicine

## 2024-07-01 DIAGNOSIS — E782 Mixed hyperlipidemia: Secondary | ICD-10-CM
